# Patient Record
Sex: Female | Born: 1945 | Race: White | Hispanic: No | Marital: Married | Smoking: Former smoker
Health system: Southern US, Community
[De-identification: ages and names within clinical notes are randomized; demographics above are authoritative.]

## PROBLEM LIST (undated history)

## (undated) DIAGNOSIS — K219 Gastro-esophageal reflux disease without esophagitis: Secondary | ICD-10-CM

## (undated) DIAGNOSIS — R42 Dizziness and giddiness: Secondary | ICD-10-CM

## (undated) DIAGNOSIS — M779 Enthesopathy, unspecified: Secondary | ICD-10-CM

## (undated) DIAGNOSIS — K759 Inflammatory liver disease, unspecified: Secondary | ICD-10-CM

## (undated) DIAGNOSIS — E785 Hyperlipidemia, unspecified: Secondary | ICD-10-CM

## (undated) DIAGNOSIS — C801 Malignant (primary) neoplasm, unspecified: Secondary | ICD-10-CM

## (undated) DIAGNOSIS — M1990 Unspecified osteoarthritis, unspecified site: Secondary | ICD-10-CM

## (undated) DIAGNOSIS — I10 Essential (primary) hypertension: Secondary | ICD-10-CM

## (undated) HISTORY — PX: TONSILLECTOMY: SUR1361

## (undated) HISTORY — PX: ABDOMINAL HYSTERECTOMY: SHX81

## (undated) HISTORY — PX: CHOLECYSTECTOMY: SHX55

---

## 2001-05-22 HISTORY — PX: BREAST BIOPSY: SHX20

## 2001-05-22 HISTORY — PX: BREAST EXCISIONAL BIOPSY: SUR124

## 2004-06-21 ENCOUNTER — Ambulatory Visit: Payer: Self-pay | Admitting: Internal Medicine

## 2005-06-22 ENCOUNTER — Ambulatory Visit: Payer: Self-pay | Admitting: Internal Medicine

## 2005-09-16 ENCOUNTER — Emergency Department: Payer: Self-pay | Admitting: Internal Medicine

## 2005-09-24 ENCOUNTER — Emergency Department: Payer: Self-pay | Admitting: Emergency Medicine

## 2006-06-27 ENCOUNTER — Ambulatory Visit: Payer: Self-pay | Admitting: Otolaryngology

## 2006-07-12 ENCOUNTER — Ambulatory Visit: Payer: Self-pay | Admitting: Internal Medicine

## 2007-07-31 ENCOUNTER — Ambulatory Visit: Payer: Self-pay | Admitting: Internal Medicine

## 2007-10-10 ENCOUNTER — Ambulatory Visit: Payer: Self-pay | Admitting: Unknown Physician Specialty

## 2008-08-06 ENCOUNTER — Ambulatory Visit: Payer: Self-pay | Admitting: Internal Medicine

## 2008-11-13 ENCOUNTER — Ambulatory Visit: Payer: Self-pay

## 2009-03-22 ENCOUNTER — Ambulatory Visit: Payer: Self-pay | Admitting: Internal Medicine

## 2009-03-30 ENCOUNTER — Ambulatory Visit: Payer: Self-pay | Admitting: Internal Medicine

## 2009-04-15 ENCOUNTER — Inpatient Hospital Stay: Payer: Self-pay | Admitting: Internal Medicine

## 2009-08-10 ENCOUNTER — Ambulatory Visit: Payer: Self-pay | Admitting: Gastroenterology

## 2009-08-11 ENCOUNTER — Ambulatory Visit: Payer: Self-pay | Admitting: Internal Medicine

## 2010-09-05 ENCOUNTER — Ambulatory Visit: Payer: Self-pay | Admitting: Internal Medicine

## 2010-12-08 ENCOUNTER — Ambulatory Visit: Payer: Self-pay | Admitting: Gastroenterology

## 2010-12-15 ENCOUNTER — Ambulatory Visit: Payer: Self-pay | Admitting: Gastroenterology

## 2010-12-21 ENCOUNTER — Ambulatory Visit: Payer: Self-pay | Admitting: Obstetrics and Gynecology

## 2011-03-13 ENCOUNTER — Ambulatory Visit: Payer: Self-pay | Admitting: Gastroenterology

## 2011-03-20 ENCOUNTER — Ambulatory Visit: Payer: Self-pay | Admitting: Gastroenterology

## 2011-03-23 ENCOUNTER — Ambulatory Visit: Payer: Self-pay | Admitting: Gastroenterology

## 2011-03-29 ENCOUNTER — Ambulatory Visit: Payer: Self-pay | Admitting: Gastroenterology

## 2011-04-02 LAB — PATHOLOGY REPORT

## 2011-04-06 ENCOUNTER — Ambulatory Visit: Payer: Self-pay | Admitting: Anesthesiology

## 2011-04-10 ENCOUNTER — Ambulatory Visit: Payer: Self-pay | Admitting: Surgery

## 2011-04-20 ENCOUNTER — Ambulatory Visit: Payer: Self-pay | Admitting: Surgery

## 2011-06-30 ENCOUNTER — Ambulatory Visit: Payer: Self-pay | Admitting: Obstetrics and Gynecology

## 2011-07-26 DIAGNOSIS — R110 Nausea: Secondary | ICD-10-CM | POA: Insufficient documentation

## 2011-09-19 ENCOUNTER — Ambulatory Visit: Payer: Self-pay | Admitting: Internal Medicine

## 2011-10-07 ENCOUNTER — Ambulatory Visit: Payer: Self-pay | Admitting: Unknown Physician Specialty

## 2011-11-20 ENCOUNTER — Emergency Department: Payer: Self-pay | Admitting: Unknown Physician Specialty

## 2012-02-06 ENCOUNTER — Ambulatory Visit: Payer: Self-pay | Admitting: Internal Medicine

## 2012-09-19 ENCOUNTER — Ambulatory Visit: Payer: Self-pay | Admitting: Internal Medicine

## 2013-02-13 ENCOUNTER — Emergency Department: Payer: Self-pay | Admitting: Emergency Medicine

## 2013-03-05 ENCOUNTER — Ambulatory Visit: Payer: Self-pay | Admitting: Internal Medicine

## 2013-04-29 ENCOUNTER — Ambulatory Visit (INDEPENDENT_AMBULATORY_CARE_PROVIDER_SITE_OTHER): Payer: Medicare Other | Admitting: Podiatry

## 2013-04-29 ENCOUNTER — Encounter: Payer: Self-pay | Admitting: Podiatry

## 2013-04-29 ENCOUNTER — Ambulatory Visit (INDEPENDENT_AMBULATORY_CARE_PROVIDER_SITE_OTHER): Payer: Medicare Other

## 2013-04-29 VITALS — BP 120/70 | HR 69 | Resp 16 | Ht 65.0 in | Wt 180.0 lb

## 2013-04-29 DIAGNOSIS — M79609 Pain in unspecified limb: Secondary | ICD-10-CM

## 2013-04-29 DIAGNOSIS — M79671 Pain in right foot: Secondary | ICD-10-CM

## 2013-04-29 DIAGNOSIS — M7750 Other enthesopathy of unspecified foot: Secondary | ICD-10-CM

## 2013-04-29 MED ORDER — TRIAMCINOLONE ACETONIDE 10 MG/ML IJ SUSP
10.0000 mg | Freq: Once | INTRAMUSCULAR | Status: AC
  Administered 2013-04-29: 10 mg

## 2013-04-29 NOTE — Patient Instructions (Signed)
Plantar Fasciitis (Heel Spur Syndrome) with Rehab The plantar fascia is a fibrous, ligament-like, soft-tissue structure that spans the bottom of the foot. Plantar fasciitis is a condition that causes pain in the foot due to inflammation of the tissue. SYMPTOMS   Pain and tenderness on the underneath side of the foot.  Pain that worsens with standing or walking. CAUSES  Plantar fasciitis is caused by irritation and injury to the plantar fascia on the underneath side of the foot. Common mechanisms of injury include:  Direct trauma to bottom of the foot.  Damage to a small nerve that runs under the foot where the main fascia attaches to the heel bone.  Stress placed on the plantar fascia due to bone spurs. RISK INCREASES WITH:   Activities that place stress on the plantar fascia (running, jumping, pivoting, or cutting).  Poor strength and flexibility.  Improperly fitted shoes.  Tight calf muscles.  Flat feet.  Failure to warm-up properly before activity.  Obesity. PREVENTION  Warm up and stretch properly before activity.  Allow for adequate recovery between workouts.  Maintain physical fitness:  Strength, flexibility, and endurance.  Cardiovascular fitness.  Maintain a health body weight.  Avoid stress on the plantar fascia.  Wear properly fitted shoes, including arch supports for individuals who have flat feet.  PROGNOSIS  If treated properly, then the symptoms of plantar fasciitis usually resolve without surgery. However, occasionally surgery is necessary.  RELATED COMPLICATIONS   Recurrent symptoms that may result in a chronic condition.  Problems of the lower back that are caused by compensating for the injury, such as limping.  Pain or weakness of the foot during push-off following surgery.  Chronic inflammation, scarring, and partial or complete fascia tear, occurring more often from repeated injections.  TREATMENT  Treatment initially involves the  use of ice and medication to help reduce pain and inflammation. The use of strengthening and stretching exercises may help reduce pain with activity, especially stretches of the Achilles tendon. These exercises may be performed at home or with a therapist. Your caregiver may recommend that you use heel cups of arch supports to help reduce stress on the plantar fascia. Occasionally, corticosteroid injections are given to reduce inflammation. If symptoms persist for greater than 6 months despite non-surgical (conservative), then surgery may be recommended.   MEDICATION   If pain medication is necessary, then nonsteroidal anti-inflammatory medications, such as aspirin and ibuprofen, or other minor pain relievers, such as acetaminophen, are often recommended.  Do not take pain medication within 7 days before surgery.  Prescription pain relievers may be given if deemed necessary by your caregiver. Use only as directed and only as much as you need.  Corticosteroid injections may be given by your caregiver. These injections should be reserved for the most serious cases, because they may only be given a certain number of times.  HEAT AND COLD  Cold treatment (icing) relieves pain and reduces inflammation. Cold treatment should be applied for 10 to 15 minutes every 2 to 3 hours for inflammation and pain and immediately after any activity that aggravates your symptoms. Use ice packs or massage the area with a piece of ice (ice massage).  Heat treatment may be used prior to performing the stretching and strengthening activities prescribed by your caregiver, physical therapist, or athletic trainer. Use a heat pack or soak the injury in warm water.  SEEK IMMEDIATE MEDICAL CARE IF:  Treatment seems to offer no benefit, or the condition worsens.  Any medications  produce adverse side effects.  EXERCISES- RANGE OF MOTION (ROM) AND STRETCHING EXERCISES - Plantar Fasciitis (Heel Spur Syndrome) These exercises  may help you when beginning to rehabilitate your injury. Your symptoms may resolve with or without further involvement from your physician, physical therapist or athletic trainer. While completing these exercises, remember:   Restoring tissue flexibility helps normal motion to return to the joints. This allows healthier, less painful movement and activity.  An effective stretch should be held for at least 30 seconds.  A stretch should never be painful. You should only feel a gentle lengthening or release in the stretched tissue.  RANGE OF MOTION - Toe Extension, Flexion  Sit with your right / left leg crossed over your opposite knee.  Grasp your toes and gently pull them back toward the top of your foot. You should feel a stretch on the bottom of your toes and/or foot.  Hold this stretch for 10 seconds.  Now, gently pull your toes toward the bottom of your foot. You should feel a stretch on the top of your toes and or foot.  Hold this stretch for 10 seconds. Repeat  times. Complete this stretch 3 times per day.   RANGE OF MOTION - Ankle Dorsiflexion, Active Assisted  Remove shoes and sit on a chair that is preferably not on a carpeted surface.  Place right / left foot under knee. Extend your opposite leg for support.  Keeping your heel down, slide your right / left foot back toward the chair until you feel a stretch at your ankle or calf. If you do not feel a stretch, slide your bottom forward to the edge of the chair, while still keeping your heel down.  Hold this stretch for 10 seconds. Repeat 3 times. Complete this stretch 2 times per day.   STRETCH  Gastroc, Standing  Place hands on wall.  Extend right / left leg, keeping the front knee somewhat bent.  Slightly point your toes inward on your back foot.  Keeping your right / left heel on the floor and your knee straight, shift your weight toward the wall, not allowing your back to arch.  You should feel a gentle stretch  in the right / left calf. Hold this position for 10 seconds. Repeat 3 times. Complete this stretch 2 times per day.  STRETCH  Soleus, Standing  Place hands on wall.  Extend right / left leg, keeping the other knee somewhat bent.  Slightly point your toes inward on your back foot.  Keep your right / left heel on the floor, bend your back knee, and slightly shift your weight over the back leg so that you feel a gentle stretch deep in your back calf.  Hold this position for 10 seconds. Repeat 3 times. Complete this stretch 2 times per day.  STRETCH  Gastrocsoleus, Standing  Note: This exercise can place a lot of stress on your foot and ankle. Please complete this exercise only if specifically instructed by your caregiver.   Place the ball of your right / left foot on a step, keeping your other foot firmly on the same step.  Hold on to the wall or a rail for balance.  Slowly lift your other foot, allowing your body weight to press your heel down over the edge of the step.  You should feel a stretch in your right / left calf.  Hold this position for 10 seconds.  Repeat this exercise with a slight bend in your right /  left knee. Repeat 3 times. Complete this stretch 2 times per day.   STRENGTHENING EXERCISES - Plantar Fasciitis (Heel Spur Syndrome)  These exercises may help you when beginning to rehabilitate your injury. They may resolve your symptoms with or without further involvement from your physician, physical therapist or athletic trainer. While completing these exercises, remember:   Muscles can gain both the endurance and the strength needed for everyday activities through controlled exercises.  Complete these exercises as instructed by your physician, physical therapist or athletic trainer. Progress the resistance and repetitions only as guided.  STRENGTH - Towel Curls  Sit in a chair positioned on a non-carpeted surface.  Place your foot on a towel, keeping your heel  on the floor.  Pull the towel toward your heel by only curling your toes. Keep your heel on the floor. Repeat 3 times. Complete this exercise 2 times per day.  STRENGTH - Ankle Inversion  Secure one end of a rubber exercise band/tubing to a fixed object (table, pole). Loop the other end around your foot just before your toes.  Place your fists between your knees. This will focus your strengthening at your ankle.  Slowly, pull your big toe up and in, making sure the band/tubing is positioned to resist the entire motion.  Hold this position for 10 seconds.  Have your muscles resist the band/tubing as it slowly pulls your foot back to the starting position. Repeat 3 times. Complete this exercises 2 times per day.  Document Released: 05/08/2005 Document Revised: 07/31/2011 Document Reviewed: 08/20/2008 [facility] Patient Information 2014 [place], [place].Plantar Fasciitis Plantar fasciitis is a common condition that causes foot pain. It is soreness (inflammation) of the band of tough fibrous tissue on the bottom of the foot that runs from the heel bone (calcaneus) to the ball of the foot. The cause of this soreness may be from excessive standing, poor fitting shoes, running on hard surfaces, being overweight, having an abnormal walk, or overuse (this is common in runners) of the painful foot or feet. It is also common in aerobic exercise dancers and ballet dancers. SYMPTOMS  Most people with plantar fasciitis complain of: Severe pain in the morning on the bottom of their foot especially when taking the first steps out of bed. This pain recedes after a few minutes of walking. Severe pain is experienced also during walking following a long period of inactivity. Pain is worse when walking barefoot or up stairs DIAGNOSIS  Your caregiver will diagnose this condition by examining and feeling your foot. Special tests such as X-rays of your foot, are usually not needed. PREVENTION  Consult a sports  medicine professional before beginning a new exercise program. Walking programs offer a good workout. With walking there is a lower chance of overuse injuries common to runners. There is less impact and less jarring of the joints. Begin all new exercise programs slowly. If problems or pain develop, decrease the amount of time or distance until you are at a comfortable level. Wear good shoes and replace them regularly. Stretch your foot and the heel cords at the back of the ankle (Achilles tendon) both before and after exercise. Run or exercise on even surfaces that are not hard. For example, asphalt is better than pavement. Do not run barefoot on hard surfaces. If using a treadmill, vary the incline. Do not continue to workout if you have foot or joint problems. Seek professional help if they do not improve. HOME CARE INSTRUCTIONS  Avoid activities that cause you  pain until you recover. Use ice or cold packs on the problem or painful areas after working out. Only take over-the-counter or prescription medicines for pain, discomfort, or fever as directed by your caregiver. Soft shoe inserts or athletic shoes with air or gel sole cushions may be helpful. If problems continue or become more severe, consult a sports medicine caregiver or your own health care provider. Cortisone is a potent anti-inflammatory medication that may be injected into the painful area. You can discuss this treatment with your caregiver. MAKE SURE YOU:  Understand these instructions. Will watch your condition. Will get help right away if you are not doing well or get worse. Document Released: 01/31/2001 Document Revised: 07/31/2011 Document Reviewed: 04/01/2008 [facility] Patient Information 2014 [place], [place].

## 2013-04-29 NOTE — Progress Notes (Signed)
   Subjective:    Patient ID: Kerry Jefferson, female    DOB: [birth year], 67 y.o.   MRN: [id number]  HPI Comments: i think i have plantar fasciitis on my right foot    N very sore  L right arch  D 2 months  O all of a sudden  C it comes and goes A worse when walking on it  T ice       Review of Systems  All other systems reviewed and are negative.       Objective:   Physical Exam        Assessment & Plan:

## 2013-04-30 NOTE — Progress Notes (Signed)
Subjective:     Patient ID: Kerry Jefferson, female   DOB: 10-Jul-1945, 67 y.o.   MRN: [id number]  Foot Pain   patient presents stating I have pain on the inside of my right foot which has been getting worse over the last 2 months. Patient states that she has been painting and on a ladder quite a bit around the time that the pain started   Review of Systems  All other systems reviewed and are negative.       Objective:   Physical Exam  Vitals reviewed. Constitutional: She is oriented to person, place, and time.  Cardiovascular: Intact distal pulses.   Musculoskeletal: Normal range of motion.  Neurological: She is oriented to person, place, and time.  Skin: Skin is warm.   neurovascular status intact with normal muscle strength noted. Tenderness around the insertion of the posterior tibial tendon into the navicular right with no indications of tendon rupture or tear. Minimal discomfort plantar heel noted     Assessment:     Posterior tibial tendinitis secondary to excessive activity and chronic structural foot condition with patient having old orthotics that she has not been wearing    Plan:     H&P and x-rays reviewed. Condition and explained and today careful injection administered at the insertion 3 mg Kenalog 5 mg Xylocaine Marcaine mixture with advice on reduced activity. Dispensed ankle brace in order to support the plantar arch and prevent stress on posterior tibial tendon and reappoint in 2 weeks

## 2013-05-13 ENCOUNTER — Ambulatory Visit: Payer: Medicare Other | Admitting: Podiatry

## 2013-11-10 DIAGNOSIS — R51 Headache: Secondary | ICD-10-CM

## 2013-11-10 DIAGNOSIS — I10 Essential (primary) hypertension: Secondary | ICD-10-CM | POA: Insufficient documentation

## 2013-11-10 DIAGNOSIS — E7800 Pure hypercholesterolemia, unspecified: Secondary | ICD-10-CM | POA: Insufficient documentation

## 2013-11-10 DIAGNOSIS — E782 Mixed hyperlipidemia: Secondary | ICD-10-CM | POA: Insufficient documentation

## 2013-11-10 DIAGNOSIS — R519 Headache, unspecified: Secondary | ICD-10-CM | POA: Insufficient documentation

## 2013-11-11 ENCOUNTER — Ambulatory Visit: Payer: Self-pay | Admitting: Internal Medicine

## 2013-11-17 DIAGNOSIS — M1990 Unspecified osteoarthritis, unspecified site: Secondary | ICD-10-CM | POA: Insufficient documentation

## 2013-12-30 DIAGNOSIS — R200 Anesthesia of skin: Secondary | ICD-10-CM | POA: Insufficient documentation

## 2013-12-30 DIAGNOSIS — R253 Fasciculation: Secondary | ICD-10-CM | POA: Insufficient documentation

## 2014-02-10 DIAGNOSIS — I10 Essential (primary) hypertension: Secondary | ICD-10-CM | POA: Insufficient documentation

## 2014-07-21 DIAGNOSIS — I491 Atrial premature depolarization: Secondary | ICD-10-CM | POA: Insufficient documentation

## 2014-09-18 ENCOUNTER — Other Ambulatory Visit: Payer: Self-pay

## 2014-09-18 ENCOUNTER — Other Ambulatory Visit: Payer: Self-pay | Admitting: Internal Medicine

## 2014-09-18 DIAGNOSIS — Z87891 Personal history of nicotine dependence: Secondary | ICD-10-CM

## 2014-09-18 DIAGNOSIS — R0789 Other chest pain: Secondary | ICD-10-CM

## 2014-09-18 DIAGNOSIS — Z1231 Encounter for screening mammogram for malignant neoplasm of breast: Secondary | ICD-10-CM

## 2014-09-24 ENCOUNTER — Ambulatory Visit
Admission: RE | Admit: 2014-09-24 | Discharge: 2014-09-24 | Disposition: A | Discharge status: Home / Self Care | Payer: Medicare Other | Source: Ambulatory Visit | Attending: Internal Medicine | Admitting: Internal Medicine

## 2014-09-24 DIAGNOSIS — R918 Other nonspecific abnormal finding of lung field: Secondary | ICD-10-CM | POA: Diagnosis not present

## 2014-09-24 DIAGNOSIS — Z87891 Personal history of nicotine dependence: Secondary | ICD-10-CM | POA: Insufficient documentation

## 2014-09-24 DIAGNOSIS — R079 Chest pain, unspecified: Secondary | ICD-10-CM | POA: Diagnosis present

## 2014-09-24 DIAGNOSIS — R0789 Other chest pain: Secondary | ICD-10-CM

## 2014-11-17 ENCOUNTER — Ambulatory Visit
Admission: RE | Admit: 2014-11-17 | Discharge: 2014-11-17 | Disposition: A | Discharge status: Home / Self Care | Payer: Medicare Other | Source: Ambulatory Visit | Attending: Internal Medicine | Admitting: Internal Medicine

## 2014-11-17 ENCOUNTER — Other Ambulatory Visit: Payer: Self-pay

## 2014-11-17 DIAGNOSIS — Z1231 Encounter for screening mammogram for malignant neoplasm of breast: Secondary | ICD-10-CM

## 2015-04-05 ENCOUNTER — Emergency Department
Admission: EM | Admit: 2015-04-05 | Discharge: 2015-04-06 | Disposition: A | Discharge status: Home / Self Care | Payer: Medicare Other | Attending: Emergency Medicine | Admitting: Emergency Medicine

## 2015-04-05 DIAGNOSIS — I160 Hypertensive urgency: Secondary | ICD-10-CM | POA: Diagnosis not present

## 2015-04-05 DIAGNOSIS — Z87891 Personal history of nicotine dependence: Secondary | ICD-10-CM | POA: Insufficient documentation

## 2015-04-05 DIAGNOSIS — I10 Essential (primary) hypertension: Secondary | ICD-10-CM | POA: Diagnosis present

## 2015-04-05 DIAGNOSIS — Z880 Allergy status to penicillin: Secondary | ICD-10-CM | POA: Diagnosis not present

## 2015-04-05 DIAGNOSIS — Z79899 Other long term (current) drug therapy: Secondary | ICD-10-CM | POA: Insufficient documentation

## 2015-04-05 DIAGNOSIS — Z7982 Long term (current) use of aspirin: Secondary | ICD-10-CM | POA: Insufficient documentation

## 2015-04-05 LAB — BASIC METABOLIC PANEL
Anion gap: 8 (ref 5–15)
BUN: 15 mg/dL (ref 6–20)
CALCIUM: 8.8 mg/dL — AB (ref 8.9–10.3)
CO2: 29 mmol/L (ref 22–32)
Chloride: 103 mmol/L (ref 101–111)
Creatinine, Ser: 0.68 mg/dL (ref 0.44–1.00)
GFR calc Af Amer: >60 mL/min (ref >60)
Glucose, Bld: 116 mg/dL — ABNORMAL HIGH (ref 65–99)
Potassium: 3.9 mmol/L (ref 3.5–5.1)
Sodium: 140 mmol/L (ref 135–145)

## 2015-04-05 LAB — TROPONIN I: Troponin I: <0.03 ng/mL (ref <0.031)

## 2015-04-05 MED ORDER — CLONIDINE HCL 0.1 MG PO TABS
0.2000 mg | ORAL_TABLET | Freq: Once | ORAL | Status: AC
  Administered 2015-04-05: 0.2 mg via ORAL
  Filled 2015-04-05: qty 2

## 2015-04-05 MED ORDER — LORAZEPAM 1 MG PO TABS
1.0000 mg | ORAL_TABLET | Freq: Once | ORAL | Status: AC
  Administered 2015-04-05: 1 mg via ORAL
  Filled 2015-04-05: qty 1

## 2015-04-05 NOTE — ED Provider Notes (Signed)
Time Seen: Approximately 2215  I have reviewed the triage notes  Chief Complaint: Hypertension   History of Present Illness: Kerry Jefferson is a 69 y.o. female who is been on ongoing treatment for hypertension. Patient's currently on Hyzaar along with Toprol-XL. The patient's had recent increase of her Toprol from the first 50 mg to 75 mg and then received another 25 mg prior to arrival after conversation with her primary physician. He said some mild feelings of lightheadedness but otherwise no significant chest pain shortness of breath, headaches, swelling in the extremities or any other concerns. Patient had similar numbers that she had here on arrival of systolic pressures over 263 and diastolic pressures approximating 90 to 100 at home. Patient was referred here by her primary physician for evaluation of her hypertension.   No past medical history on file.  There are no active problems to display for this patient.   Past Surgical History  Procedure Laterality Date  . Breast biopsy Left 2003    negative    Past Surgical History  Procedure Laterality Date  . Breast biopsy Left 2003    negative    Current Outpatient Rx  Name  Route  Sig  Dispense  Refill  . aspirin EC 81 MG tablet   Oral   Take 1 tablet by mouth daily.         . Cholecalciferol (VITAMIN D-1000 MAX ST) 1000 UNITS tablet   Oral   Take 1 tablet by mouth daily.         Marland Kitchen losartan-hydrochlorothiazide (HYZAAR) 100-12.5 MG per tablet   Oral   Take 1 tablet by mouth daily.          . metaxalone (SKELAXIN) 800 MG tablet   Oral   Take 1 tablet by mouth 3 (three) times daily as needed. Pt has not started this medication yet.         . Omega-3 1000 MG CAPS   Oral   Take 1 capsule by mouth daily.         . simvastatin (ZOCOR) 80 MG tablet   Oral   Take 80 mg by mouth daily at 6 PM.          . TOPROL XL 100 MG 24 hr tablet   Oral   Take 100 mg by mouth daily.            Allergies:   Ciprofloxacin; Penicillins; and Sulfa antibiotics  Family History: Family History  Problem Relation Age of Onset  . Breast cancer Maternal Grandmother 80    Social History: Social History  Substance Use Topics  . Smoking status: Former Research scientist (life sciences)  . Smokeless tobacco: Never Used     Comment: quit 10 years ago  . Alcohol Use: No     Review of Systems:   10 point review of systems was performed and was otherwise negative:  Constitutional: No fever Eyes: No visual disturbances ENT: No sore throat, ear pain Cardiac: No chest pain Respiratory: No shortness of breath, wheezing, or stridor Abdomen: No abdominal pain, no vomiting, No diarrhea Endocrine: No weight loss, No night sweats Extremities: No peripheral edema, cyanosis Skin: No rashes, easy bruising Neurologic: No focal weakness, trouble with speech or swollowing Urologic: No dysuria, Hematuria, or urinary frequency   Physical Exam:  ED Triage Vitals  Enc Vitals Group     BP 04/05/15 2036 200/90 mmHg     Pulse Rate 04/05/15 2036 68     Resp --  Temp 04/05/15 2036 98.3 F (36.8 C)     Temp Source 04/05/15 2036 Oral     SpO2 04/05/15 2036 98 %     Weight 04/05/15 2036 190 lb (86.183 kg)     Height 04/05/15 2036 5' 5"  (1.651 m)     Head Cir --      Peak Flow --      Pain Score --      Pain Loc --      Pain Edu? --      Excl. in [place]? --     General: Awake , Alert , and Oriented times 3; GCS 15 Head: Normal cephalic , atraumatic Eyes: Pupils equal , round, reactive to light Nose/Throat: No nasal drainage, patent upper airway without erythema or exudate.  Neck: Supple, Full range of motion, No anterior adenopathy or palpable thyroid masses Lungs: Clear to ascultation without wheezes , rhonchi, or rales Heart: Regular rate, regular rhythm without murmurs , gallops , or rubs Abdomen: Soft, non tender without rebound, guarding , or rigidity; bowel sounds positive and symmetric in all 4 quadrants. No organomegaly  .        Extremities: 2 plus symmetric pulses. No edema, clubbing or cyanosis Neurologic: normal ambulation, Motor symmetric without deficits, sensory intact Skin: warm, dry, no rashes   Labs:   All laboratory work was reviewed including any pertinent negatives or positives listed below:  Labs Reviewed  [place]  CBC WITH DIFFERENTIAL/PLATELET  TROPONIN I   Laboratory work was reviewed and showed no significant abnormalities EKG: * ED ECG REPORT I, Daymon Larsen, the attending physician, personally viewed and interpreted this ECG.  Date: 04/05/2015 EKG Time: 2042 Rate: 64 Rhythm: normal sinus rhythm QRS Axis: normal Intervals: normal ST/T Wave abnormalities: Nonspecific ST-T wave abnormality Conduction Disutrbances: none Narrative Interpretation: unremarkable       ED Course: Patient's stay here was uneventful and she was given clonidine 0.2 mg by mouth along with Ativan 1 mg by mouth for symptomatic improvement. Serial blood pressures were obtained and the patient dropped down into the low 825K systolic. Patient feels symptomatically improved and felt could be treated on an outpatient basis. She was prescribed clonidine 0.1 mg as needed at home for systolic pressures in the 170 range. Then advised to follow up with her primary physician for further fine tuning of her medication to see if it is any other adjustments that would like to be made.    Assessment:  Hypertensive urgency     Plan:  Outpatient management Patient was advised to return immediately if condition worsens. Patient was advised to follow up with her primary care physician or other specialized physicians involved and in their current assessment.             Daymon Larsen, MD 04/06/15 [phone]

## 2015-04-05 NOTE — ED Notes (Signed)
Pt to triage via w/c with no distress noted; pt reports BP elevated at home this evening accomp by dizziness; recently toprol increased by BP has been increasing as well

## 2015-04-06 LAB — CBC WITH DIFFERENTIAL/PLATELET
BAND NEUTROPHILS: 0 %
BASOS ABS: 0.1 10*3/uL (ref 0–0.1)
BLASTS: 0 %
Basophils Relative: 1 %
EOS ABS: 0.1 10*3/uL (ref 0–0.7)
Eosinophils Relative: 1 %
HCT: 39.9 % (ref 35.0–47.0)
HEMOGLOBIN: 13.1 g/dL (ref 12.0–16.0)
Lymphocytes Relative: 18 %
Lymphs Abs: 1.9 10*3/uL (ref 1.0–3.6)
MCH: 28.2 pg (ref 26.0–34.0)
MCHC: 32.9 g/dL (ref 32.0–36.0)
MCV: 85.6 fL (ref 80.0–100.0)
MONO ABS: 0.6 10*3/uL (ref 0.2–0.9)
MYELOCYTES: 0 %
Metamyelocytes Relative: 0 %
Monocytes Relative: 6 %
Neutro Abs: 7.7 10*3/uL — ABNORMAL HIGH (ref 1.4–6.5)
Neutrophils Relative %: 74 %
OTHER: 0 %
Platelets: 159 10*3/uL (ref 150–440)
Promyelocytes Absolute: 0 %
RBC: 4.66 MIL/uL (ref 3.80–5.20)
RDW: 13.8 % (ref 11.5–14.5)
WBC: 10.4 10*3/uL (ref 3.6–11.0)
nRBC: 0 /100 WBC

## 2015-04-06 MED ORDER — CLONIDINE HCL 0.1 MG PO TABS: 0.1000 mg | ORAL_TABLET | Freq: Two times a day (BID) | ORAL | Status: DC | PRN

## 2015-06-11 ENCOUNTER — Other Ambulatory Visit: Payer: Self-pay | Admitting: Internal Medicine

## 2015-06-11 DIAGNOSIS — M542 Cervicalgia: Secondary | ICD-10-CM

## 2015-06-15 ENCOUNTER — Ambulatory Visit (INDEPENDENT_AMBULATORY_CARE_PROVIDER_SITE_OTHER): Payer: Medicare Other | Admitting: Sports Medicine

## 2015-06-15 ENCOUNTER — Encounter: Payer: Self-pay | Admitting: Sports Medicine

## 2015-06-15 ENCOUNTER — Ambulatory Visit (INDEPENDENT_AMBULATORY_CARE_PROVIDER_SITE_OTHER): Payer: Medicare Other

## 2015-06-15 DIAGNOSIS — M79671 Pain in right foot: Secondary | ICD-10-CM

## 2015-06-15 DIAGNOSIS — M7661 Achilles tendinitis, right leg: Secondary | ICD-10-CM | POA: Diagnosis not present

## 2015-06-15 NOTE — Progress Notes (Signed)
Patient ID: Kerry Jefferson, female   DOB: 1945/10/10, 70 y.o.   MRN: [id number] Subjective: Kerry Jefferson is a 70 y.o. female patient who presents to office for evaluation of Right posterior heel pain. Patient complains of progressive pain at the back of her right heel that started 1 month after walking on threadmill. Patient has tried heat with no relief in symptoms. Patient denies any other pedal complaints.   There are no active problems to display for this patient.  Current Outpatient Prescriptions on File Prior to Visit  Medication Sig Dispense Refill  . aspirin EC 81 MG tablet Take 1 tablet by mouth daily.    . Cholecalciferol (VITAMIN D-1000 MAX ST) 1000 UNITS tablet Take 1 tablet by mouth daily.    . cloNIDine (CATAPRES) 0.1 MG tablet Take 1 tablet (0.1 mg total) by mouth 2 (two) times daily as needed. 20 tablet 0  . losartan-hydrochlorothiazide (HYZAAR) 100-12.5 MG per tablet Take 1 tablet by mouth daily.     . metaxalone (SKELAXIN) 800 MG tablet Take 1 tablet by mouth 3 (three) times daily as needed. Pt has not started this medication yet.    . Omega-3 1000 MG CAPS Take 1 capsule by mouth daily.    . simvastatin (ZOCOR) 80 MG tablet Take 80 mg by mouth daily at 6 PM.     . TOPROL XL 100 MG 24 hr tablet Take 100 mg by mouth daily.      No current facility-administered medications on file prior to visit.   Allergies  Allergen Reactions  . Atenolol Other (See Comments)    fatigue  . Ciprofloxacin Other (See Comments)    Caused drug induced hepatitis   . Ciprofloxacin Hcl Other (See Comments)    Hepatitis  . Lisinopril Other (See Comments)    Fatigue  . Moxifloxacin Other (See Comments)    Hepatitis  . Sertraline Other (See Comments)  . Sulfa Antibiotics Other (See Comments)    Hallucinations   . Penicillins Itching and Other (See Comments)    pruritus   Objective:  General: Alert and oriented x3 in no acute distress  Dermatology: No open lesions bilateral lower  extremities, no webspace macerations, no ecchymosis bilateral, all nails x 10 are well manicured.  Vascular: Dorsalis Pedis and Posterior Tibial pedal pulses 2/4, Capillary Fill Time 3 seconds, + pedal hair growth bilateral, Temperature gradient within normal limits.  Neurology: Gross sensation intact via light touch bilateral. - Tinels sign.   Musculoskeletal: Mild tenderness with palpation at insertion of the Achilles on Right, there is no calcaneal exostosis but mild soft tissue swelling at lateral portion on the insertion present and mild decreased ankle rom with knee extending  vs flexed resembling gastroc equnius bilateral, The achilles tendon feels intact with no nodularity or palpable dell, Thompson sign negative, Subtalar and midtarsal joint range of motion is within normal limits, there is no 1st ray hypermobility or bunion, hammertoe, midfoot exostosis deformity noted bilateral.   Xrays  Right foot    Impression: Mild decreased in osseous mineralization. Joint spaces narrowing at 1st MTPJ, dorsal midfoot with spurs, bunion and hammertoe deformity, calcaneal spur posterior and inferior. No fracture/dislocation/boney destruction. Kager's triangle intact with no obliteration. No soft tissue abnormalities or radiopaque foreign bodies.   Assessment and Plan: Problem List Items Addressed This Visit    None    Visit Diagnoses    Right foot pain    -  Primary    Relevant Orders  DG Foot 2 Views Right    Tendonitis, Achilles, right          -Complete examination performed -Xrays reviewed -Discussed treatement options -Patient declined oral anti-inflammatories due to hx of LFT rise from Avalox  -Dispensed night splint and ice pack and instructed on daily use -Advised patient to refrain from use of heat -Advised patient to refrain from walking/exercise that involves inclines, hills, steps to prevent exacerbation of symptoms  -No improvement will consider MRI/PT/EPAT -Patient to  return to office in 2 weeks or sooner if condition worsens.  Landis Martins, DPM

## 2015-06-29 ENCOUNTER — Encounter: Payer: Self-pay | Admitting: Sports Medicine

## 2015-06-29 ENCOUNTER — Ambulatory Visit (INDEPENDENT_AMBULATORY_CARE_PROVIDER_SITE_OTHER): Payer: Medicare Other | Admitting: Sports Medicine

## 2015-06-29 DIAGNOSIS — M7661 Achilles tendinitis, right leg: Secondary | ICD-10-CM | POA: Diagnosis not present

## 2015-06-29 DIAGNOSIS — M79671 Pain in right foot: Secondary | ICD-10-CM | POA: Diagnosis not present

## 2015-06-29 NOTE — Progress Notes (Addendum)
Patient ID: WAFAA DEEMER, female   DOB: 02-05-1946, 70 y.o.   MRN: [id number]  Subjective: LAKEISA HENINGER is a 70 y.o. female patient who returnss to office for evaluation of Right posterior heel pain. Patient states that her heel is starting to feel better after icing and night splint especially over the last few days. Patient denies any other pedal complaints.   There are no active problems to display for this patient.  Current Outpatient Prescriptions on File Prior to Visit  Medication Sig Dispense Refill  . aspirin EC 81 MG tablet Take 1 tablet by mouth daily.    . Cholecalciferol (VITAMIN D-1000 MAX ST) 1000 UNITS tablet Take 1 tablet by mouth daily.    . cloNIDine (CATAPRES) 0.1 MG tablet Take 1 tablet (0.1 mg total) by mouth 2 (two) times daily as needed. 20 tablet 0  . losartan-hydrochlorothiazide (HYZAAR) 100-12.5 MG per tablet Take 1 tablet by mouth daily.     . metaxalone (SKELAXIN) 800 MG tablet Take 1 tablet by mouth 3 (three) times daily as needed. Pt has not started this medication yet.    . Omega-3 1000 MG CAPS Take 1 capsule by mouth daily.    . simvastatin (ZOCOR) 80 MG tablet Take 80 mg by mouth daily at 6 PM.     . TOPROL XL 100 MG 24 hr tablet Take 100 mg by mouth daily.      No current facility-administered medications on file prior to visit.   Allergies  Allergen Reactions  . Atenolol Other (See Comments)    fatigue  . Ciprofloxacin Other (See Comments)    Caused drug induced hepatitis   . Ciprofloxacin Hcl Other (See Comments)    Hepatitis  . Lisinopril Other (See Comments)    Fatigue  . Moxifloxacin Other (See Comments)    Hepatitis  . Sertraline Other (See Comments)  . Sulfa Antibiotics Other (See Comments)    Hallucinations   . Penicillins Itching and Other (See Comments)    pruritus   Objective:  General: Alert and oriented x3 in no acute distress  Dermatology: No open lesions bilateral lower extremities, no webspace macerations, no ecchymosis  bilateral, all nails x 10 are well manicured.  Vascular: Dorsalis Pedis and Posterior Tibial pedal pulses 2/4, Capillary Fill Time 3 seconds, + pedal hair growth bilateral, Temperature gradient within normal limits.  Neurology: Gross sensation intact via light touch bilateral. - Tinels sign.   Musculoskeletal: Decreased tenderness with palpation at insertion of the Achilles on Right, there is no calcaneal exostosis but mild soft tissue swelling at lateral portion on the insertion present and mild decreased ankle rom with knee extending  vs flexed resembling gastroc equnius bilateral, The achilles tendon feels intact with no nodularity or palpable dell, Thompson sign negative, Subtalar and midtarsal joint range of motion is within normal limits, there is no 1st ray hypermobility or bunion, hammertoe, midfoot exostosis deformity noted bilateral.    Assessment and Plan: Problem List Items Addressed This Visit    None    Visit Diagnoses    Right foot pain    -  Primary    Tendonitis, Achilles, right          -Complete examination performed -Discussed treatement options -Patient declined oral anti-inflammatories due to hx of LFT rise from Avalox  -Cont with night splint and ice pack daily until symptoms are completely resolved -Advised patient to refrain from walking/exercise that involves inclines, hills, steps to prevent exacerbation of symptoms; May attempt  to walk on flat surfaces only in moderation -Recommend good supportive shoes daily -Rx given for PT at [facility]; Patient wants to check with her doctor about her neck because she may have PT for that too and wants to get it done at the same place -Patient to return to office in 4 weeks or sooner if condition worsens.  07-29-15 ADDENDUM: PATIENT CALLED OFFICE TODAY STATING THAT SHE IS HAVING MORE PAIN WITH PT; ADVISED TO DISCONTINUE PT FOR NOW AND PATIENT WAS ADVISED TO STOP BY OFFICE TO PICK UP CAM BOOT OF WHICH SHE DID TODAY AROUND 2PM;  PATIENT WAS EDUCATED ON HOW TO USE THE BOOT; PATIENT TO FOLLOW UP WITH ME ON 3-31 AND TO WEAR THE BOOT UNTIL THEN; PATIENT TO CONTINUE WITH ICE AND NIGHT SPLINT IN THE MEANTIME AND ADVISED TO REFRAIN FROM EXTENSIVE ACTIVITY THAT MAY CAUSE EXACERBATION OF SYMPTOMS  Landis Martins, DPM

## 2015-07-01 ENCOUNTER — Ambulatory Visit
Admission: RE | Admit: 2015-07-01 | Discharge: 2015-07-01 | Disposition: A | Discharge status: Home / Self Care | Payer: Medicare Other | Source: Ambulatory Visit | Attending: Internal Medicine | Admitting: Internal Medicine

## 2015-07-01 DIAGNOSIS — M2578 Osteophyte, vertebrae: Secondary | ICD-10-CM | POA: Insufficient documentation

## 2015-07-01 DIAGNOSIS — M542 Cervicalgia: Secondary | ICD-10-CM | POA: Insufficient documentation

## 2015-07-01 DIAGNOSIS — M50323 Other cervical disc degeneration at C6-C7 level: Secondary | ICD-10-CM | POA: Diagnosis not present

## 2015-07-01 DIAGNOSIS — M4312 Spondylolisthesis, cervical region: Secondary | ICD-10-CM | POA: Diagnosis not present

## 2015-07-21 DIAGNOSIS — E785 Hyperlipidemia, unspecified: Secondary | ICD-10-CM | POA: Insufficient documentation

## 2015-07-29 ENCOUNTER — Telehealth: Payer: Self-pay | Admitting: Sports Medicine

## 2015-07-29 ENCOUNTER — Other Ambulatory Visit: Payer: Self-pay | Admitting: Sports Medicine

## 2015-07-29 NOTE — Telephone Encounter (Signed)
I spoke with the patient. She is going to come by the office around 2 PM today to pick up a CAM boot. Thanks Dr. Cannon Kettle

## 2015-07-29 NOTE — Telephone Encounter (Signed)
Patient called said she has 2 more visits next week of physical therapy at [facility] and she said that her Achilles Tendonitis is no better, it is actually getting worse with this therapy. She is scheduled to come back here and see Dr. Cannon Kettle on 08/20/15 but she said she thinks Dr. Cannon Kettle needs to put her in a boot or something else before that. Please call patient at home at [phone] to discuss her plan of treatment going forward and if she needs to keep those last two appointments next week at [facility].  Thank you !

## 2015-07-29 NOTE — Telephone Encounter (Signed)
Returned patient called. Reports that she is getting more pain after PT. Recommend to hold off on continuing physical therapy for now. Instructed patient to go by office to pick up Cam Walker to use daily for ambulation to continue with icing and elevation. No extensive activity or stretching, continue with night splint only while in bed for 1 hour. Patient expressed understanding. We'll follow up as scheduled on 3-31 or sooner if problems or issues arise. -Dr. Cannon Kettle

## 2015-08-20 ENCOUNTER — Ambulatory Visit (INDEPENDENT_AMBULATORY_CARE_PROVIDER_SITE_OTHER): Payer: Medicare Other | Admitting: Sports Medicine

## 2015-08-20 ENCOUNTER — Encounter: Payer: Self-pay | Admitting: Sports Medicine

## 2015-08-20 ENCOUNTER — Telehealth: Payer: Self-pay | Admitting: *Deleted

## 2015-08-20 DIAGNOSIS — Z8719 Personal history of other diseases of the digestive system: Secondary | ICD-10-CM | POA: Insufficient documentation

## 2015-08-20 DIAGNOSIS — Z8619 Personal history of other infectious and parasitic diseases: Secondary | ICD-10-CM | POA: Insufficient documentation

## 2015-08-20 DIAGNOSIS — M7661 Achilles tendinitis, right leg: Secondary | ICD-10-CM | POA: Diagnosis not present

## 2015-08-20 DIAGNOSIS — M810 Age-related osteoporosis without current pathological fracture: Secondary | ICD-10-CM | POA: Insufficient documentation

## 2015-08-20 DIAGNOSIS — K635 Polyp of colon: Secondary | ICD-10-CM | POA: Insufficient documentation

## 2015-08-20 DIAGNOSIS — N83201 Unspecified ovarian cyst, right side: Secondary | ICD-10-CM | POA: Insufficient documentation

## 2015-08-20 DIAGNOSIS — N809 Endometriosis, unspecified: Secondary | ICD-10-CM | POA: Insufficient documentation

## 2015-08-20 DIAGNOSIS — K219 Gastro-esophageal reflux disease without esophagitis: Secondary | ICD-10-CM | POA: Insufficient documentation

## 2015-08-20 DIAGNOSIS — M79671 Pain in right foot: Secondary | ICD-10-CM | POA: Diagnosis not present

## 2015-08-20 DIAGNOSIS — J309 Allergic rhinitis, unspecified: Secondary | ICD-10-CM | POA: Insufficient documentation

## 2015-08-20 DIAGNOSIS — T148XXA Other injury of unspecified body region, initial encounter: Secondary | ICD-10-CM

## 2015-08-20 DIAGNOSIS — M8580 Other specified disorders of bone density and structure, unspecified site: Secondary | ICD-10-CM | POA: Insufficient documentation

## 2015-08-20 NOTE — Telephone Encounter (Addendum)
-----   Message from Landis Martins, [place] sent at 08/20/2015 10:16 AM EDT ----- Regarding: MRI Right Please order and arrange MRI w/o contrast right ankle r/o partial tear achilles vs acute on chronic tendonitis  Thanks Dr. Cannon Kettle 08/23/2015-[facility] [place].  FAXED TO [place].

## 2015-08-20 NOTE — Progress Notes (Signed)
Patient ID: ARLYN BUMPUS, female   DOB: 07/13/1945, 70 y.o.   MRN: [id number]   Subjective: Kerry Jefferson is a 70 y.o. female patient who returns to office for evaluation of Right posterior heel pain. Patient has been in cam boot since she had a flareup with physical therapy, of which we discontinued at this time, patient has been using CAM boot for the last 2-1/2 weeks with improvement in symptoms. However states that when she did get up without the boot on she noticed that she had pain in toe off or when the area was directly pressed.  Patient denies any other pedal complaints.   Patient Active Problem List   Diagnosis Date Noted  . Allergic rhinitis 08/20/2015  . Colon polyp 08/20/2015  . Cyst of right ovary 08/20/2015  . Endometriosis 08/20/2015  . Acid reflux 08/20/2015  . History of colitis 08/20/2015  . H/O viral illness 08/20/2015  . Osteopenia 08/20/2015  . 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 HLD (hyperlipidemia) 07/21/2015  . APC (atrial premature contractions) 07/21/2014  . Essential (primary) hypertension 02/10/2014  . Facial numbness 12/30/2013  . Jerking 12/30/2013  . Arthritis, degenerative 11/17/2013  . Benign essential HTN 11/10/2013  . Cephalalgia 11/10/2013  . Pure hypercholesterolemia 11/10/2013  . Feeling bilious 07/26/2011   Current Outpatient Prescriptions on File Prior to Visit  Medication Sig Dispense Refill  . aspirin EC 81 MG tablet Take 1 tablet by mouth daily.    . Cholecalciferol (VITAMIN D-1000 MAX ST) 1000 UNITS tablet Take 1 tablet by mouth daily.    . cloNIDine (CATAPRES) 0.1 MG tablet Take 1 tablet (0.1 mg total) by mouth 2 (two) times daily as needed. 20 tablet 0  . losartan-hydrochlorothiazide (HYZAAR) 100-12.5 MG per tablet Take 1 tablet by mouth daily.     . metaxalone (SKELAXIN) 800 MG tablet Take 1 tablet by mouth 3 (three) times daily as needed. Pt has not started this medication yet.    . Omega-3 1000 MG CAPS Take 1 capsule by  mouth daily.    . simvastatin (ZOCOR) 80 MG tablet Take 80 mg by mouth daily at 6 PM.     . TOPROL XL 100 MG 24 hr tablet Take 100 mg by mouth daily.      No current facility-administered medications on file prior to visit.   Allergies  Allergen Reactions  . Atenolol Other (See Comments)    fatigue  . Ciprofloxacin Other (See Comments)    Caused drug induced hepatitis   . Ciprofloxacin Hcl Other (See Comments)    Hepatitis  . Lisinopril Other (See Comments)    Fatigue  . Moxifloxacin Other (See Comments)    Hepatitis  . Sertraline Other (See Comments)  . Sulfa Antibiotics Other (See Comments)    Hallucinations   . Penicillins Itching and Other (See Comments)    pruritus   Objective:  General: Alert and oriented x3 in no acute distress  Dermatology: No open lesions bilateral lower extremities, no webspace macerations, no ecchymosis bilateral, all nails x 10 are well manicured.  Vascular: Dorsalis Pedis and Posterior Tibial pedal pulses 2/4, Capillary Fill Time 3 seconds, + pedal hair growth bilateral, Temperature gradient within normal limits.  Neurology: Gross sensation intact via light touch bilateral. - Tinels sign.   Musculoskeletal: There is tenderness with palpation at lateral insertion of the Achilles extending into the watershed area on Right, there is no calcaneal exostosis but mild soft tissue swelling at lateral portion on the insertion present and  mild decreased ankle rom with knee extending  vs flexed resembling gastroc equnius bilateral, The achilles tendon feels intact with no nodularity or palpable dell, Thompson sign remains negative, Subtalar and midtarsal joint range of motion is within normal limits, there is no 1st ray hypermobility or bunion,+ hammertoe, midfoot exostosis deformity noted bilateral.    Assessment and Plan: Problem List Items Addressed This Visit    None    Visit Diagnoses    Tendonitis, Achilles, right    -  Primary    Right foot pain           -Complete examination performed -Discussed treatement options -Ordered, right ankle MRI to evaluate for acute on chronic changes versus partial tear in setting of increased episode of pain -Strongly recommended anti-inflammatories. Patient states that she has meloxicam 7.5 mg I advised patient to start taking those to systemically help for inflammation that is occurring along this tendon complex. Patient expressed understanding; counseled patient to discontinue if any adverse effects our experience -Patient to slowly transition from cam walker to good supportive sneaker with heel lifts as instructed  -Cont with night splint and ice pack daily until symptoms are completely resolved -Advised patient to refrain from walking/exercise that involves inclines, hills, steps to prevent exacerbation of symptoms; advised patient to limit walking to necessity. -Patient to return to office after MRI or sooner if condition worsens.  Landis Martins, DPM

## 2015-09-02 ENCOUNTER — Other Ambulatory Visit: Payer: Self-pay

## 2015-09-02 ENCOUNTER — Emergency Department: Payer: Medicare Other

## 2015-09-02 ENCOUNTER — Emergency Department
Admission: EM | Admit: 2015-09-02 | Discharge: 2015-09-02 | Disposition: A | Discharge status: Home / Self Care | Payer: Medicare Other | Attending: Emergency Medicine | Admitting: Emergency Medicine

## 2015-09-02 DIAGNOSIS — Z87891 Personal history of nicotine dependence: Secondary | ICD-10-CM | POA: Diagnosis not present

## 2015-09-02 DIAGNOSIS — E785 Hyperlipidemia, unspecified: Secondary | ICD-10-CM | POA: Diagnosis not present

## 2015-09-02 DIAGNOSIS — Z79899 Other long term (current) drug therapy: Secondary | ICD-10-CM | POA: Diagnosis not present

## 2015-09-02 DIAGNOSIS — R0789 Other chest pain: Secondary | ICD-10-CM | POA: Insufficient documentation

## 2015-09-02 DIAGNOSIS — R079 Chest pain, unspecified: Secondary | ICD-10-CM

## 2015-09-02 DIAGNOSIS — Z7982 Long term (current) use of aspirin: Secondary | ICD-10-CM | POA: Diagnosis not present

## 2015-09-02 DIAGNOSIS — I10 Essential (primary) hypertension: Secondary | ICD-10-CM | POA: Insufficient documentation

## 2015-09-02 HISTORY — DX: Hyperlipidemia, unspecified: E78.5

## 2015-09-02 HISTORY — DX: Enthesopathy, unspecified: M77.9

## 2015-09-02 HISTORY — DX: Gastro-esophageal reflux disease without esophagitis: K21.9

## 2015-09-02 HISTORY — DX: Essential (primary) hypertension: I10

## 2015-09-02 LAB — CBC
HCT: 40.4 % (ref 35.0–47.0)
HEMOGLOBIN: 13.6 g/dL (ref 12.0–16.0)
MCH: 28.5 pg (ref 26.0–34.0)
MCHC: 33.6 g/dL (ref 32.0–36.0)
MCV: 84.9 fL (ref 80.0–100.0)
Platelets: 173 10*3/uL (ref 150–440)
RBC: 4.76 MIL/uL (ref 3.80–5.20)
RDW: 14 % (ref 11.5–14.5)
WBC: 9.2 10*3/uL (ref 3.6–11.0)

## 2015-09-02 LAB — HEPATIC FUNCTION PANEL
ALT: 20 U/L (ref 14–54)
AST: 20 U/L (ref 15–41)
Albumin: 4.4 g/dL (ref 3.5–5.0)
Alkaline Phosphatase: 57 U/L (ref 38–126)
BILIRUBIN DIRECT: 0.1 mg/dL (ref 0.1–0.5)
Indirect Bilirubin: 0.7 mg/dL (ref 0.3–0.9)
TOTAL PROTEIN: 7 g/dL (ref 6.5–8.1)
Total Bilirubin: 0.8 mg/dL (ref 0.3–1.2)

## 2015-09-02 LAB — BASIC METABOLIC PANEL
ANION GAP: 10 (ref 5–15)
BUN: 17 mg/dL (ref 6–20)
CALCIUM: 8.8 mg/dL — AB (ref 8.9–10.3)
CO2: 25 mmol/L (ref 22–32)
Chloride: 98 mmol/L — ABNORMAL LOW (ref 101–111)
Creatinine, Ser: 0.95 mg/dL (ref 0.44–1.00)
GFR, EST NON AFRICAN AMERICAN: 59 mL/min — AB (ref >60)
GLUCOSE: 101 mg/dL — AB (ref 65–99)
POTASSIUM: 3.3 mmol/L — AB (ref 3.5–5.1)
SODIUM: 133 mmol/L — AB (ref 135–145)

## 2015-09-02 LAB — TROPONIN I

## 2015-09-02 LAB — LIPASE, BLOOD: LIPASE: 26 U/L (ref 11–51)

## 2015-09-02 MED ORDER — GI COCKTAIL ~~LOC~~
30.0000 mL | Freq: Once | ORAL | Status: AC
  Administered 2015-09-02: 30 mL via ORAL
  Filled 2015-09-02: qty 30

## 2015-09-02 MED ORDER — SUCRALFATE 1 G PO TABS: 1.0000 g | ORAL_TABLET | Freq: Four times a day (QID) | ORAL | Status: DC

## 2015-09-02 NOTE — ED Notes (Signed)
-  pt c/o epigastric into the chest pain/pressure for the past 4-5 days, worse today.. Denies SOB/N/V.Marland Kitchen

## 2015-09-02 NOTE — ED Provider Notes (Signed)
[facility] Emergency Department Provider Note   ____________________________________________  Time seen: ~2115  I have reviewed the triage vital signs and the nursing notes.   HISTORY  Chief Complaint Chest Pain   History limited by: Not Limited   HPI Kerry Jefferson is a 70 y.o. female who presents to the emergency department today because of concerns for a pressure type feeling in her chest. She describes it as being located in the lower central chest. She describes it as a sensation of needing to burp. She states this started roughly 4-5 days ago. It has been fairly constant since then. She states that she has had occasional burps that do not really relieve the symptoms. The patient states that she has not had any shortness of breath, nausea or vomiting with it. No change in defecation. No fevers.    Past Medical History  Diagnosis Date  . Hypertension   . Hyperlipidemia   . GERD (gastroesophageal reflux disease)   . Tendinitis     Patient Active Problem List   Diagnosis Date Noted  . Allergic rhinitis 08/20/2015  . Colon polyp 08/20/2015  . Cyst of right ovary 08/20/2015  . Endometriosis 08/20/2015  . Acid reflux 08/20/2015  . History of colitis 08/20/2015  . H/O viral illness 08/20/2015  . Osteopenia 08/20/2015  . 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 HLD (hyperlipidemia) 07/21/2015  . APC (atrial premature contractions) 07/21/2014  . Essential (primary) hypertension 02/10/2014  . Facial numbness 12/30/2013  . Jerking 12/30/2013  . Arthritis, degenerative 11/17/2013  . Benign essential HTN 11/10/2013  . Cephalalgia 11/10/2013  . Pure hypercholesterolemia 11/10/2013  . Feeling bilious 07/26/2011    Past Surgical History  Procedure Laterality Date  . Breast biopsy Left 2003    negative  . Tonsillectomy    . Cholecystectomy    . Abdominal hysterectomy      Current Outpatient Rx  Name  Route  Sig  Dispense  Refill  .  aspirin EC 81 MG tablet   Oral   Take 1 tablet by mouth daily.         . Cholecalciferol (VITAMIN D-1000 MAX ST) 1000 UNITS tablet   Oral   Take 1 tablet by mouth daily.         . cloNIDine (CATAPRES) 0.1 MG tablet   Oral   Take 1 tablet (0.1 mg total) by mouth 2 (two) times daily as needed.   20 tablet   0   . losartan-hydrochlorothiazide (HYZAAR) 100-12.5 MG per tablet   Oral   Take 1 tablet by mouth daily.          . metaxalone (SKELAXIN) 800 MG tablet   Oral   Take 1 tablet by mouth 3 (three) times daily as needed. Pt has not started this medication yet.         . Omega-3 1000 MG CAPS   Oral   Take 1 capsule by mouth daily.         . simvastatin (ZOCOR) 80 MG tablet   Oral   Take 80 mg by mouth daily at 6 PM.          . TOPROL XL 100 MG 24 hr tablet   Oral   Take 100 mg by mouth daily.            Allergies Atenolol; Ciprofloxacin; Ciprofloxacin hcl; Lisinopril; Moxifloxacin; Sertraline; Sulfa antibiotics; and Penicillins  Family History  Problem Relation Age of Onset  . Breast cancer Maternal Grandmother 45  Social History Social History  Substance Use Topics  . Smoking status: Former Research scientist (life sciences)  . Smokeless tobacco: Never Used     Comment: quit 10 years ago  . Alcohol Use: No    Review of Systems  Constitutional: Negative for fever. Cardiovascular: Positive for chest pressure Respiratory: Negative for shortness of breath. Gastrointestinal: Negative for abdominal pain, vomiting and diarrhea. Neurological: Negative for headaches, focal weakness or numbness.  10-point ROS otherwise negative.  ____________________________________________   PHYSICAL EXAM:  VITAL SIGNS: ED Triage Vitals  Enc Vitals Group     BP 09/02/15 1834 165/68 mmHg     Pulse Rate 09/02/15 1834 82     Resp 09/02/15 1834 18     Temp 09/02/15 1834 98.2 F (36.8 C)     Temp Source 09/02/15 1834 Oral     SpO2 09/02/15 1834 98 %     Weight 09/02/15 1834 190 lb  (86.183 kg)     Height 09/02/15 1834 5' 5"  (1.651 m)     Head Cir --      Peak Flow --      Pain Score 09/02/15 1835 2   Constitutional: Alert and oriented. Well appearing and in no distress. Eyes: Conjunctivae are normal. PERRL. Normal extraocular movements. ENT   Head: Normocephalic and atraumatic.   Nose: No congestion/rhinnorhea.   Mouth/Throat: Mucous membranes are moist.   Neck: No stridor. Hematological/Lymphatic/Immunilogical: No cervical lymphadenopathy. Cardiovascular: Normal rate, regular rhythm.  No murmurs, rubs, or gallops. Respiratory: Normal respiratory effort without tachypnea nor retractions. Breath sounds are clear and equal bilaterally. No wheezes/rales/rhonchi. Gastrointestinal: Soft and nontender. No distention.  Genitourinary: Deferred Musculoskeletal: Normal range of motion in all extremities. No joint effusions.  No lower extremity tenderness nor edema. Neurologic:  Normal speech and language. No gross focal neurologic deficits are appreciated.  Skin:  Skin is warm, dry and intact. No rash noted. Psychiatric: Mood and affect are normal. Speech and behavior are normal. Patient exhibits appropriate insight and judgment.  ____________________________________________    LABS (pertinent positives/negatives)  Labs Reviewed  BASIC METABOLIC PANEL - Abnormal; Notable for the following:    Sodium 133 (*)    Potassium 3.3 (*)    Chloride 98 (*)    Glucose, Bld 101 (*)    Calcium 8.8 (*)    GFR calc non Af Amer 59 (*)    All other components within normal limits  CBC  TROPONIN I  LIPASE, BLOOD  HEPATIC FUNCTION PANEL     ____________________________________________   EKG  I, Nance Pear, attending physician, personally viewed and interpreted this EKG  EKG Time: 1832 Rate: 78 Rhythm: normal sinus rhythm Axis: left axis deviation Intervals: qtc 462 QRS: narrow, LVH ST changes: no st elevation Impression: abnormal  ekg  ____________________________________________    RADIOLOGY  CXR IMPRESSION: Scarring left base. No edema or consolidation  ____________________________________________   PROCEDURES  Procedure(s) performed: None  Critical Care performed: No  ____________________________________________   INITIAL IMPRESSION / ASSESSMENT AND PLAN / ED COURSE  Pertinent labs & imaging results that were available during my care of the patient were reviewed by me and considered in my medical decision making (see chart for details).  Patient resents to the emergency department today because of concerns for a sense of chest pressure. Patient's workup here without concerning findings. I highly doubt ACS given the unusual history, no other symptoms, 4-5 days without an elevation troponin. This point I think GI issues possible. Patient does have a history of GERD.  Will place patient on sucralfate. She states she does have an appointment with GI doctor later this month.  ____________________________________________   FINAL CLINICAL IMPRESSION(S) / ED DIAGNOSES  Final diagnoses:  Chest pain, unspecified chest pain type     Nance Pear, MD 09/02/15 2326

## 2015-09-02 NOTE — Discharge Instructions (Signed)
Please seek medical attention for any high fevers, chest pain, shortness of breath, change in behavior, persistent vomiting, bloody stool or any other new or concerning symptoms. ° ° °Nonspecific Chest Pain °It is often hard to find the cause of chest pain. There is always a chance that your pain could be related to something serious, such as a heart attack or a blood clot in your lungs. Chest pain can also be caused by conditions that are not life-threatening. If you have chest pain, it is very important to follow up with your doctor. ° °HOME CARE °· If you were prescribed an antibiotic medicine, finish it all even if you start to feel better. °· Avoid any activities that cause chest pain. °· Do not use any tobacco products, including cigarettes, chewing tobacco, or electronic cigarettes. If you need help quitting, ask your doctor. °· Do not drink alcohol. °· Take medicines only as told by your doctor. °· Keep all follow-up visits as told by your doctor. This is important. This includes any further testing if your chest pain does not go away. °· Your doctor may tell you to keep your head raised (elevated) while you sleep. °· Make lifestyle changes as told by your doctor. These may include: °¨ Getting regular exercise. Ask your doctor to suggest some activities that are safe for you. °¨ Eating a heart-healthy diet. Your doctor or a diet specialist (dietitian) can help you to learn healthy eating options. °¨ Maintaining a healthy weight. °¨ Managing diabetes, if necessary. °¨ Reducing stress. °GET HELP IF: °· Your chest pain does not go away, even after treatment. °· You have a rash with blisters on your chest. °· You have a fever. °GET HELP RIGHT AWAY IF: °· Your chest pain is worse. °· You have an increasing cough, or you cough up blood. °· You have severe belly (abdominal) pain. °· You feel extremely weak. °· You pass out (faint). °· You have chills. °· You have sudden, unexplained chest discomfort. °· You have  sudden, unexplained discomfort in your arms, back, neck, or jaw. °· You have shortness of breath at any time. °· You suddenly start to sweat, or your skin gets clammy. °· You feel nauseous. °· You vomit. °· You suddenly feel light-headed or dizzy. °· Your heart begins to beat quickly, or it feels like it is skipping beats. °These symptoms may be an emergency. Do not wait to see if the symptoms will go away. Get medical help right away. Call your local emergency services (911 in the [place] Do not drive yourself to the hospital. °  °This information is not intended to replace advice given to you by your health care provider. Make sure you discuss any questions you have with your health care provider. °  °Document Released: 10/25/2007 Document Revised: 05/29/2014 Document Reviewed: 12/12/2013 °Elsevier Interactive Patient Education ©2016 [employer] ° °

## 2015-09-07 ENCOUNTER — Emergency Department
Admission: EM | Admit: 2015-09-07 | Discharge: 2015-09-07 | Disposition: A | Discharge status: Home / Self Care | Payer: Medicare Other | Attending: Student | Admitting: Student

## 2015-09-07 ENCOUNTER — Encounter: Payer: Self-pay | Admitting: Emergency Medicine

## 2015-09-07 DIAGNOSIS — Z881 Allergy status to other antibiotic agents status: Secondary | ICD-10-CM | POA: Insufficient documentation

## 2015-09-07 DIAGNOSIS — I10 Essential (primary) hypertension: Secondary | ICD-10-CM | POA: Insufficient documentation

## 2015-09-07 DIAGNOSIS — Z880 Allergy status to penicillin: Secondary | ICD-10-CM | POA: Insufficient documentation

## 2015-09-07 DIAGNOSIS — Y93G1 Activity, food preparation and clean up: Secondary | ICD-10-CM | POA: Insufficient documentation

## 2015-09-07 DIAGNOSIS — Y92000 Kitchen of unspecified non-institutional (private) residence as  the place of occurrence of the external cause: Secondary | ICD-10-CM | POA: Diagnosis not present

## 2015-09-07 DIAGNOSIS — Z87891 Personal history of nicotine dependence: Secondary | ICD-10-CM | POA: Insufficient documentation

## 2015-09-07 DIAGNOSIS — M1990 Unspecified osteoarthritis, unspecified site: Secondary | ICD-10-CM | POA: Diagnosis not present

## 2015-09-07 DIAGNOSIS — Z9049 Acquired absence of other specified parts of digestive tract: Secondary | ICD-10-CM | POA: Diagnosis not present

## 2015-09-07 DIAGNOSIS — Z7982 Long term (current) use of aspirin: Secondary | ICD-10-CM | POA: Insufficient documentation

## 2015-09-07 DIAGNOSIS — S61210A Laceration without foreign body of right index finger without damage to nail, initial encounter: Secondary | ICD-10-CM | POA: Diagnosis present

## 2015-09-07 DIAGNOSIS — W25XXXA Contact with sharp glass, initial encounter: Secondary | ICD-10-CM | POA: Diagnosis not present

## 2015-09-07 DIAGNOSIS — Z888 Allergy status to other drugs, medicaments and biological substances status: Secondary | ICD-10-CM | POA: Insufficient documentation

## 2015-09-07 DIAGNOSIS — Z90710 Acquired absence of both cervix and uterus: Secondary | ICD-10-CM | POA: Diagnosis not present

## 2015-09-07 DIAGNOSIS — Z79899 Other long term (current) drug therapy: Secondary | ICD-10-CM | POA: Insufficient documentation

## 2015-09-07 DIAGNOSIS — Y999 Unspecified external cause status: Secondary | ICD-10-CM | POA: Diagnosis not present

## 2015-09-07 DIAGNOSIS — E785 Hyperlipidemia, unspecified: Secondary | ICD-10-CM | POA: Insufficient documentation

## 2015-09-07 NOTE — ED Notes (Signed)
Lac to right hand, top of index finger.  Bleeding controlled.

## 2015-09-07 NOTE — ED Provider Notes (Signed)
[facility] Emergency Department Provider Note  ____________________________________________  Time seen: Approximately 11:29 AM  I have reviewed the triage vital signs and the nursing notes.   HISTORY  Chief Complaint Laceration   HPI Kerry Jefferson is a 70 y.o. female is here with laceration to her right hand. Patient states she was washing a glass that was cracked at the topwhich cut her hand. Patient is up-to-date on her immunizations. She denies any difficulty with range of motion or sensation. She rates her pain as a 1/10.   Past Medical History  Diagnosis Date  . Hypertension   . Hyperlipidemia   . GERD (gastroesophageal reflux disease)   . Tendinitis     Patient Active Problem List   Diagnosis Date Noted  . Allergic rhinitis 08/20/2015  . Colon polyp 08/20/2015  . Cyst of right ovary 08/20/2015  . Endometriosis 08/20/2015  . Acid reflux 08/20/2015  . History of colitis 08/20/2015  . H/O viral illness 08/20/2015  . Osteopenia 08/20/2015  . 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 HLD (hyperlipidemia) 07/21/2015  . APC (atrial premature contractions) 07/21/2014  . Essential (primary) hypertension 02/10/2014  . Facial numbness 12/30/2013  . Jerking 12/30/2013  . Arthritis, degenerative 11/17/2013  . Benign essential HTN 11/10/2013  . Cephalalgia 11/10/2013  . Pure hypercholesterolemia 11/10/2013  . Feeling bilious 07/26/2011    Past Surgical History  Procedure Laterality Date  . Breast biopsy Left 2003    negative  . Tonsillectomy    . Cholecystectomy    . Abdominal hysterectomy      Current Outpatient Rx  Name  Route  Sig  Dispense  Refill  . aspirin EC 81 MG tablet   Oral   Take 1 tablet by mouth daily.         . Cholecalciferol (VITAMIN D-1000 MAX ST) 1000 UNITS tablet   Oral   Take 1 tablet by mouth daily.         . cloNIDine (CATAPRES) 0.1 MG tablet   Oral   Take 1 tablet (0.1 mg total) by mouth 2 (two)  times daily as needed.   20 tablet   0   . losartan-hydrochlorothiazide (HYZAAR) 100-12.5 MG per tablet   Oral   Take 1 tablet by mouth daily.          . metaxalone (SKELAXIN) 800 MG tablet   Oral   Take 1 tablet by mouth 3 (three) times daily as needed. Pt has not started this medication yet.         . Omega-3 1000 MG CAPS   Oral   Take 1 capsule by mouth daily.         . simvastatin (ZOCOR) 80 MG tablet   Oral   Take 80 mg by mouth daily at 6 PM.          . sucralfate (CARAFATE) 1 g tablet   Oral   Take 1 tablet (1 g total) by mouth 4 (four) times daily.   60 tablet   0   . TOPROL XL 100 MG 24 hr tablet   Oral   Take 100 mg by mouth daily.            Allergies Atenolol; Ciprofloxacin; Ciprofloxacin hcl; Lisinopril; Moxifloxacin; Sertraline; Sulfa antibiotics; and Penicillins  Family History  Problem Relation Age of Onset  . Breast cancer Maternal Grandmother 80    Social History Social History  Substance Use Topics  . Smoking status: Former Research scientist (life sciences)  . Smokeless tobacco:  Never Used     Comment: quit 10 years ago  . Alcohol Use: No    Review of Systems Constitutional: No fever/chills Cardiovascular: Denies chest pain. Respiratory: Denies shortness of breath. Gastrointestinal:  No nausea, no vomiting.   Musculoskeletal: Negative for hand pain. Skin: Positive for laceration right hand Neurological: Negative for headaches, focal weakness or numbness.  10-point ROS otherwise negative.  ____________________________________________   PHYSICAL EXAM:  VITAL SIGNS: ED Triage Vitals  Enc Vitals Group     BP --      Pulse --      Resp --      Temp --      Temp src --      SpO2 --      Weight --      Height --      Head Cir --      Peak Flow --      Pain Score 09/07/15 1028 1     Pain Loc --      Pain Edu? --      Excl. in [place]? --     Constitutional: Alert and oriented. Well appearing and in no acute distress. Eyes: Conjunctivae are  normal. PERRL. EOMI. Head: Atraumatic. Nose: No congestion/rhinnorhea. Neck: No stridor.   Cardiovascular: Normal rate, regular rhythm. Grossly normal heart sounds.  Good peripheral circulation. Respiratory: Normal respiratory effort.  No retractions. Lungs CTAB. Musculoskeletal: There is superficial laceration across the MP joint of the right index finger dorsal aspect. No active bleeding was noted. There is no foreign bodies present. She is able to flex and extend. Motor sensory function intact. Capillary refill less than 3 seconds. Neurologic:  Normal speech and language. No gross focal neurologic deficits are appreciated. No gait instability. Skin:  Skin is warm, dry and intact. Laceration is noted above. Psychiatric: Mood and affect are normal. Speech and behavior are normal.  ____________________________________________   LABS (all labs ordered are listed, but only abnormal results are displayed)  Labs Reviewed - No data to display   PROCEDURES  Procedure(s) performed: LACERATION REPAIR Performed by: Gweneth Fritter PA-S Authorized by: Johnn Hai Consent: Verbal consent obtained. Risks and benefits: risks, benefits and alternatives were discussed Consent given by: patient Patient identity confirmed: provided demographic data Wound explored  Laceration Location: right MP joint dorsal aspect  Laceration Length: 2.0 cm  No Foreign Bodies seen or palpated  Irrigation method: syringe Amount of cleaning: standard  Skin closure: With combination of Dermabond and Steri-Strips.   Technique: As above   Patient tolerance: Patient tolerated the procedure well with no immediate complications.  Critical Care performed: No  ____________________________________________   INITIAL IMPRESSION / ASSESSMENT AND PLAN / ED COURSE  Pertinent labs & imaging results that were available during my care of the patient were reviewed by me and considered in my medical decision making  (see chart for details).  Patient was given information about wound care and will follow-up with her primary care doctor if any continued problems or signs of infection. ____________________________________________   FINAL CLINICAL IMPRESSION(S) / ED DIAGNOSES  Final diagnoses:  Laceration of right index finger w/o foreign body w/o damage to nail, initial encounter      Johnn Hai, PA-C 09/07/15 [place] Gay[place], MD 09/07/15 [phone]

## 2015-09-07 NOTE — Discharge Instructions (Signed)
Follow-up with Dr. Doy Hutching if any continued problems or urgent concerns.

## 2015-09-07 NOTE — ED Notes (Signed)
See triage note  Laceration to right hand/finger  Bleeding controlled

## 2015-09-15 ENCOUNTER — Ambulatory Visit
Admission: RE | Admit: 2015-09-15 | Discharge: 2015-09-15 | Disposition: A | Discharge status: Home / Self Care | Payer: Medicare Other | Source: Ambulatory Visit | Attending: Sports Medicine | Admitting: Sports Medicine

## 2015-09-15 DIAGNOSIS — T148XXA Other injury of unspecified body region, initial encounter: Secondary | ICD-10-CM

## 2015-09-15 DIAGNOSIS — M722 Plantar fascial fibromatosis: Secondary | ICD-10-CM | POA: Diagnosis not present

## 2015-09-15 DIAGNOSIS — M7661 Achilles tendinitis, right leg: Secondary | ICD-10-CM | POA: Insufficient documentation

## 2015-09-15 DIAGNOSIS — M659 Synovitis and tenosynovitis, unspecified: Secondary | ICD-10-CM | POA: Diagnosis not present

## 2015-09-15 DIAGNOSIS — M19071 Primary osteoarthritis, right ankle and foot: Secondary | ICD-10-CM | POA: Diagnosis not present

## 2015-09-15 DIAGNOSIS — R601 Generalized edema: Secondary | ICD-10-CM | POA: Insufficient documentation

## 2015-09-21 ENCOUNTER — Encounter: Payer: Self-pay | Admitting: Sports Medicine

## 2015-09-21 ENCOUNTER — Ambulatory Visit (INDEPENDENT_AMBULATORY_CARE_PROVIDER_SITE_OTHER): Payer: Medicare Other | Admitting: Sports Medicine

## 2015-09-21 DIAGNOSIS — M7661 Achilles tendinitis, right leg: Secondary | ICD-10-CM | POA: Diagnosis not present

## 2015-09-21 DIAGNOSIS — M79671 Pain in right foot: Secondary | ICD-10-CM

## 2015-09-21 MED ORDER — METHYLPREDNISOLONE 4 MG PO TBPK: ORAL_TABLET | ORAL | Status: DC

## 2015-09-21 MED ORDER — MELOXICAM 15 MG PO TABS: 15.0000 mg | ORAL_TABLET | Freq: Every day | ORAL | Status: DC

## 2015-09-21 NOTE — Progress Notes (Signed)
Patient ID: Kerry Jefferson, female   DOB: 19-May-1946, 70 y.o.   MRN: [id number]  Subjective: Kerry Jefferson is a 70 y.o. female patient who returns to office for MRI review and re-evaluation of Right posterior heel pain. Patient states that she feels about the same as last visit with no significant improvement; at insertion of achilles area still hurts when directly pressed; reports there was 1 day that she had to go back to using CAM boot otherwise she has been tolerating the heel lift and a good shoe fine with flare ups requiring boot.  Patient denies any other pedal complaints.   Patient Active Problem List   Diagnosis Date Noted  . Allergic rhinitis 08/20/2015  . Colon polyp 08/20/2015  . Cyst of right ovary 08/20/2015  . Endometriosis 08/20/2015  . Acid reflux 08/20/2015  . History of colitis 08/20/2015  . H/O viral illness 08/20/2015  . Osteopenia 08/20/2015  . 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 HLD (hyperlipidemia) 07/21/2015  . APC (atrial premature contractions) 07/21/2014  . Essential (primary) hypertension 02/10/2014  . Facial numbness 12/30/2013  . Jerking 12/30/2013  . Arthritis, degenerative 11/17/2013  . Benign essential HTN 11/10/2013  . Cephalalgia 11/10/2013  . Pure hypercholesterolemia 11/10/2013  . Feeling bilious 07/26/2011   Current Outpatient Prescriptions on File Prior to Visit  Medication Sig Dispense Refill  . aspirin EC 81 MG tablet Take 1 tablet by mouth daily.    . Cholecalciferol (VITAMIN D-1000 MAX ST) 1000 UNITS tablet Take 1 tablet by mouth daily.    . cloNIDine (CATAPRES) 0.1 MG tablet Take 1 tablet (0.1 mg total) by mouth 2 (two) times daily as needed. 20 tablet 0  . losartan-hydrochlorothiazide (HYZAAR) 100-12.5 MG per tablet Take 1 tablet by mouth daily.     . metaxalone (SKELAXIN) 800 MG tablet Take 1 tablet by mouth 3 (three) times daily as needed. Pt has not started this medication yet.    . Omega-3 1000 MG CAPS Take 1 capsule by mouth  daily.    . simvastatin (ZOCOR) 80 MG tablet Take 80 mg by mouth daily at 6 PM.     . sucralfate (CARAFATE) 1 g tablet Take 1 tablet (1 g total) by mouth 4 (four) times daily. 60 tablet 0  . TOPROL XL 100 MG 24 hr tablet Take 100 mg by mouth daily.      No current facility-administered medications on file prior to visit.   Allergies  Allergen Reactions  . Atenolol Other (See Comments)    fatigue  . Ciprofloxacin Other (See Comments)    Caused drug induced hepatitis   . Ciprofloxacin Hcl Other (See Comments)    Hepatitis  . Lisinopril Other (See Comments)    Fatigue  . Moxifloxacin Other (See Comments)    Hepatitis  . Sertraline Other (See Comments)  . Sulfa Antibiotics Other (See Comments)    Hallucinations   . Penicillins Itching and Other (See Comments)    pruritus   Objective:  General: Alert and oriented x3 in no acute distress  Dermatology: No open lesions bilateral lower extremities, no webspace macerations, no ecchymosis bilateral, all nails x 10 are well manicured.  Vascular: Dorsalis Pedis and Posterior Tibial pedal pulses 2/4, Capillary Fill Time 3 seconds, + pedal hair growth bilateral, Temperature gradient within normal limits.  Neurology: Gross sensation intact via light touch bilateral. - Tinels sign.   Musculoskeletal: There is tenderness with palpation at central and lateral insertion of the Achilles extending into the watershed area  on Right, there is no calcaneal exostosis but mild soft tissue swelling at lateral portion on the insertion present and mild decreased ankle rom with knee extending  vs flexed resembling gastroc equnius bilateral, The achilles tendon feels intact with no nodularity or palpable dell, Thompson sign remains negative, Subtalar and midtarsal joint range of motion is within normal limits, there is no 1st ray hypermobility or bunion,+ hammertoe, midfoot exostosis deformity noted bilateral.    MRI- achilles tendonitis, plantar fasciitis, pes  planus with PT tendonitis and arthritis  Assessment and Plan: Problem List Items Addressed This Visit    None    Visit Diagnoses    Tendonitis, Achilles, right    -  Primary    Relevant Medications    methylPREDNISolone (MEDROL DOSEPAK) 4 MG TBPK tablet    meloxicam (MOBIC) 15 MG tablet    Right foot pain        Relevant Medications    methylPREDNISolone (MEDROL DOSEPAK) 4 MG TBPK tablet    meloxicam (MOBIC) 15 MG tablet      -Complete examination performed -Discussed treatement options for tendonitis and fascitis  -Patient would like to try another round of medication before deciding on EPAT -Rx Mobic 15 mg to start after Medrol dose pack  -Cont with good supportive sneaker with heel lifts as instructed and CAM walker as needed for acute pain -Cont with night splint as tolerated and ice pack daily until symptoms are completely resolved -Advised patient to refrain from walking/exercise that involves inclines, hills, steps to prevent exacerbation of symptoms; advised patient to limit walking to necessity. -Patient to return to office in 3 weeks for follow up eval and to further discuss EPAT if pain is not improved or sooner if condition worsens.  Landis Martins, DPM

## 2015-09-28 ENCOUNTER — Other Ambulatory Visit: Payer: Self-pay | Admitting: Internal Medicine

## 2015-09-28 DIAGNOSIS — Z1231 Encounter for screening mammogram for malignant neoplasm of breast: Secondary | ICD-10-CM

## 2015-10-12 ENCOUNTER — Ambulatory Visit: Payer: Medicare Other | Admitting: Sports Medicine

## 2015-10-21 ENCOUNTER — Encounter: Payer: Self-pay | Admitting: *Deleted

## 2015-10-22 ENCOUNTER — Ambulatory Visit
Admission: RE | Admit: 2015-10-22 | Discharge: 2015-10-22 | Disposition: A | Discharge status: Home / Self Care | Payer: Medicare Other | Source: Ambulatory Visit | Attending: Unknown Physician Specialty | Admitting: Unknown Physician Specialty

## 2015-10-22 ENCOUNTER — Encounter
Admission: RE | Disposition: A | Discharge status: Home / Self Care | Payer: Self-pay | Source: Ambulatory Visit | Attending: Unknown Physician Specialty

## 2015-10-22 ENCOUNTER — Encounter: Payer: Self-pay | Admitting: *Deleted

## 2015-10-22 ENCOUNTER — Ambulatory Visit: Payer: Medicare Other | Admitting: Anesthesiology

## 2015-10-22 DIAGNOSIS — K219 Gastro-esophageal reflux disease without esophagitis: Secondary | ICD-10-CM | POA: Diagnosis not present

## 2015-10-22 DIAGNOSIS — K5730 Diverticulosis of large intestine without perforation or abscess without bleeding: Secondary | ICD-10-CM | POA: Diagnosis not present

## 2015-10-22 DIAGNOSIS — I10 Essential (primary) hypertension: Secondary | ICD-10-CM | POA: Diagnosis not present

## 2015-10-22 DIAGNOSIS — D122 Benign neoplasm of ascending colon: Secondary | ICD-10-CM | POA: Diagnosis not present

## 2015-10-22 DIAGNOSIS — Z1211 Encounter for screening for malignant neoplasm of colon: Secondary | ICD-10-CM | POA: Diagnosis not present

## 2015-10-22 DIAGNOSIS — E785 Hyperlipidemia, unspecified: Secondary | ICD-10-CM | POA: Diagnosis not present

## 2015-10-22 DIAGNOSIS — Z79899 Other long term (current) drug therapy: Secondary | ICD-10-CM | POA: Insufficient documentation

## 2015-10-22 DIAGNOSIS — Z86010 Personal history of colonic polyps: Secondary | ICD-10-CM | POA: Diagnosis not present

## 2015-10-22 DIAGNOSIS — D127 Benign neoplasm of rectosigmoid junction: Secondary | ICD-10-CM | POA: Diagnosis not present

## 2015-10-22 DIAGNOSIS — K640 First degree hemorrhoids: Secondary | ICD-10-CM | POA: Diagnosis not present

## 2015-10-22 DIAGNOSIS — Z7982 Long term (current) use of aspirin: Secondary | ICD-10-CM | POA: Insufficient documentation

## 2015-10-22 HISTORY — PX: COLONOSCOPY WITH PROPOFOL: SHX5780

## 2015-10-22 SURGERY — COLONOSCOPY WITH PROPOFOL
Anesthesia: General

## 2015-10-22 MED ORDER — PROPOFOL 500 MG/50ML IV EMUL
INTRAVENOUS | Status: DC | PRN
  Administered 2015-10-22: 120 ug/kg/min via INTRAVENOUS

## 2015-10-22 MED ORDER — SODIUM CHLORIDE 0.9 % IV SOLN
INTRAVENOUS | Status: DC
  Administered 2015-10-22: 10:00:00 via INTRAVENOUS

## 2015-10-22 MED ORDER — LIDOCAINE 2% (20 MG/ML) 5 ML SYRINGE
INTRAMUSCULAR | Status: DC | PRN
  Administered 2015-10-22: 50 mg via INTRAVENOUS

## 2015-10-22 MED ORDER — SODIUM CHLORIDE 0.9 % IV SOLN: INTRAVENOUS | Status: DC

## 2015-10-22 MED ORDER — MIDAZOLAM HCL 5 MG/5ML IJ SOLN
INTRAMUSCULAR | Status: DC | PRN
  Administered 2015-10-22: 1 mg via INTRAVENOUS

## 2015-10-22 MED ORDER — PROPOFOL 10 MG/ML IV BOLUS
INTRAVENOUS | Status: DC | PRN
  Administered 2015-10-22: 70 mg via INTRAVENOUS
  Administered 2015-10-22: 30 mg via INTRAVENOUS

## 2015-10-22 NOTE — Anesthesia Postprocedure Evaluation (Signed)
Anesthesia Post Note  Patient: Kerry Jefferson  Procedure(s) Performed: Procedure(s) (LRB): COLONOSCOPY WITH PROPOFOL (N/A)  Patient location during evaluation: Endoscopy Anesthesia Type: General Level of consciousness: awake and alert Pain management: pain level controlled Vital Signs Assessment: post-procedure vital signs reviewed and stable Respiratory status: spontaneous breathing, nonlabored ventilation, respiratory function stable and patient connected to nasal cannula oxygen Cardiovascular status: blood pressure returned to baseline and stable Postop Assessment: no signs of nausea or vomiting Anesthetic complications: no    Last Vitals:  Filed Vitals:   10/22/15 1125 10/22/15 1135  BP: 129/70 139/67  Pulse: 59 53  Temp:    Resp: 15 14    Last Pain: There were no vitals filed for this visit.               Sayyid Harewood S

## 2015-10-22 NOTE — Transfer of Care (Signed)
Immediate Anesthesia Transfer of Care Note  Patient: Kerry Jefferson  Procedure(s) Performed: Procedure(s): COLONOSCOPY WITH PROPOFOL (N/A)  Patient Location: Endoscopy Unit  Anesthesia Type:General  Level of Consciousness: awake and alert   Airway & Oxygen Therapy: Patient Spontanous Breathing and Patient connected to nasal cannula oxygen  Post-op Assessment: Report given to RN  Post vital signs: Reviewed  Last Vitals:  Filed Vitals:   10/22/15 0939 10/22/15 1105  BP: 173/82 97/50  Pulse: 72 62  Temp: 37.3 C 35.9 C  Resp: 22 18    Last Pain: There were no vitals filed for this visit.       Complications: No apparent anesthesia complications

## 2015-10-22 NOTE — Anesthesia Preprocedure Evaluation (Signed)
Anesthesia Evaluation  Patient identified by MRN, date of birth, ID band Patient awake    Reviewed: Allergy & Precautions, NPO status , Patient's Chart, lab work & pertinent test results, reviewed documented beta blocker date and time   Airway Mallampati: II  TM Distance: >3 FB     Dental  (+) Chipped   Pulmonary former smoker,           Cardiovascular hypertension, Pt. on medications and Pt. on home beta blockers      Neuro/Psych  Headaches,    GI/Hepatic GERD  ,  Endo/Other    Renal/GU      Musculoskeletal  (+) Arthritis ,   Abdominal   Peds  Hematology   Anesthesia Other Findings   Reproductive/Obstetrics                             Anesthesia Physical Anesthesia Plan  ASA: III  Anesthesia Plan: General   Post-op Pain Management:    Induction: Intravenous  Airway Management Planned: Nasal Cannula  Additional Equipment:   Intra-op Plan:   Post-operative Plan:   Informed Consent: I have reviewed the patients History and Physical, chart, labs and discussed the procedure including the risks, benefits and alternatives for the proposed anesthesia with the patient or authorized representative who has indicated his/her understanding and acceptance.     Plan Discussed with: CRNA  Anesthesia Plan Comments:         Anesthesia Quick Evaluation

## 2015-10-22 NOTE — Op Note (Signed)
[facility] Patient Name: Kerry Jefferson Procedure Date: 10/22/2015 10:27 AM MRN: [id number] Account #: [id number] Date of Birth: November 22, 1945 Admit Type: Outpatient Age: 70 Room: [facility] ENDO ROOM 4 Gender: Female Note Status: Finalized Procedure:            Colonoscopy Indications:          High risk colon cancer surveillance: Personal history                        of colonic polyps Providers:            Manya Silvas, MD Referring MD:         Leonie Douglas. Doy Hutching, MD (Referring MD) Medicines:            Propofol per Anesthesia Complications:        No immediate complications. Procedure:            Pre-Anesthesia Assessment:                       - After reviewing the risks and benefits, the patient                        was deemed in satisfactory condition to undergo the                        procedure.                       After obtaining informed consent, the colonoscope was                        passed under direct vision. Throughout the procedure,                        the patient's blood pressure, pulse, and oxygen                        saturations were monitored continuously. The                        Colonoscope was introduced through the anus and                        advanced to the the cecum, identified by appendiceal                        orifice and ileocecal valve. The colonoscopy was                        performed without difficulty. The patient tolerated the                        procedure well. The quality of the bowel preparation                        was excellent. Findings:      A diminutive polyp was found in the recto-sigmoid colon. The polyp was       sessile. The polyp was removed with a jumbo cold forceps. Resection and       retrieval were complete.      Three sessile polyps were  found in the ascending colon. The polyps were       diminutive in size. These polyps were removed with a jumbo cold forceps.   Resection and retrieval were complete.      Multiple small and large-mouthed diverticula were found in the sigmoid       colon, descending colon, transverse colon and ascending colon.      Internal hemorrhoids were found during endoscopy. The hemorrhoids were       small and Grade I (internal hemorrhoids that do not prolapse).      The exam was otherwise without abnormality. Impression:           - One diminutive polyp at the recto-sigmoid colon,                        removed with a jumbo cold forceps. Resected and                        retrieved.                       - Three diminutive polyps in the ascending colon,                        removed with a jumbo cold forceps. Resected and                        retrieved.                       - Diverticulosis in the sigmoid colon, in the                        descending colon, in the transverse colon and in the                        ascending colon.                       - Internal hemorrhoids.                       - The examination was otherwise normal. Recommendation:       - Await pathology results. Manya Silvas, MD 10/22/2015 11:04:40 AM This report has been signed electronically. Number of Addenda: 0 Note Initiated On: 10/22/2015 10:27 AM Scope Withdrawal Time: 0 hours 14 minutes 41 seconds  Total Procedure Duration: 0 hours 25 minutes 53 seconds       [facility]

## 2015-10-22 NOTE — H&P (Signed)
Primary Care Physician:  Idelle Crouch, MD Primary Gastroenterologist:  Dr. Vira Agar  Pre-Procedure History & Physical: HPI:  Kerry Jefferson is a 70 y.o. female is here for an colonoscopy.   Past Medical History  Diagnosis Date  . Hypertension   . Hyperlipidemia   . GERD (gastroesophageal reflux disease)   . Tendinitis     Past Surgical History  Procedure Laterality Date  . Breast biopsy Left 2003    negative  . Tonsillectomy    . Cholecystectomy    . Abdominal hysterectomy      Prior to Admission medications   Medication Sig Start Date End Date Taking? Authorizing Provider  Cholecalciferol (VITAMIN D-1000 MAX ST) 1000 UNITS tablet Take 1 tablet by mouth daily.   Yes Historical Provider, MD  losartan-hydrochlorothiazide (HYZAAR) 100-12.5 MG per tablet Take 1 tablet by mouth daily.  04/07/13  Yes Historical Provider, MD  Omega-3 1000 MG CAPS Take 1 capsule by mouth daily. 05/24/11  Yes Historical Provider, MD  simvastatin (ZOCOR) 80 MG tablet Take 80 mg by mouth daily at 6 PM.  04/07/13  Yes Historical Provider, MD  sucralfate (CARAFATE) 1 g tablet Take 1 tablet (1 g total) by mouth 4 (four) times daily. 09/02/15  Yes Nance Pear, MD  TOPROL XL 100 MG 24 hr tablet Take 100 mg by mouth daily.  03/04/13  Yes Historical Provider, MD  aspirin EC 81 MG tablet Take 1 tablet by mouth daily.    Historical Provider, MD  cloNIDine (CATAPRES) 0.1 MG tablet Take 1 tablet (0.1 mg total) by mouth 2 (two) times daily as needed. 04/06/15 04/05/16  Daymon Larsen, MD  meloxicam (MOBIC) 15 MG tablet Take 1 tablet (15 mg total) by mouth daily. 09/21/15   Landis Martins, DPM  metaxalone (SKELAXIN) 800 MG tablet Take 1 tablet by mouth 3 (three) times daily as needed. Pt has not started this medication yet. 03/30/15   Historical Provider, MD  methylPREDNISolone (MEDROL DOSEPAK) 4 MG TBPK tablet Take as instructed 09/21/15   Landis Martins, DPM    Allergies as of 10/11/2015 - Review Complete  09/21/2015  Allergen Reaction Noted  . Atenolol Other (See Comments) 06/15/2015  . Ciprofloxacin Other (See Comments) 04/29/2013  . Ciprofloxacin hcl Other (See Comments) 06/15/2015  . Lisinopril Other (See Comments) 06/15/2015  . Moxifloxacin Other (See Comments) 06/15/2015  . Sertraline Other (See Comments) 06/15/2015  . Sulfa antibiotics Other (See Comments) 04/29/2013  . Penicillins Itching and Other (See Comments) 04/29/2013    Family History  Problem Relation Age of Onset  . Breast cancer Maternal Grandmother 80    Social History   Social History  . Marital Status: Married    Spouse Name: N/A  . Number of Children: N/A  . Years of Education: N/A   Occupational History  . Not on file.   Social History Main Topics  . Smoking status: Former Research scientist (life sciences)  . Smokeless tobacco: Never Used     Comment: quit 10 years ago  . Alcohol Use: No  . Drug Use: No  . Sexual Activity: Not on file   Other Topics Concern  . Not on file   Social History Narrative    Review of Systems: See HPI, otherwise negative ROS  Physical Exam: BP 173/82 mmHg  Pulse 72  Temp(Src) 99.1 F (37.3 C) (Tympanic)  Resp 22  Ht 5' 5"  (1.651 m)  Wt 86.183 kg (190 lb)  BMI 31.62 kg/m2  SpO2 97% General:   Alert,  pleasant and cooperative in NAD Head:  Normocephalic and atraumatic. Neck:  Supple; no masses or thyromegaly. Lungs:  Clear throughout to auscultation.    Heart:  Regular rate and rhythm. Abdomen:  Soft, nontender and nondistended. Normal bowel sounds, without guarding, and without rebound.   Neurologic:  Alert and  oriented x4;  grossly normal neurologically.  Impression/Plan: Kerry Jefferson is here for an colonoscopy to be performed for [facility] colon polyps  Risks, benefits, limitations, and alternatives regarding  colonoscopy have been reviewed with the patient.  Questions have been answered.  All parties agreeable.   Kerry Cheers, MD  10/22/2015, 10:30 AM

## 2015-10-25 ENCOUNTER — Encounter: Payer: Self-pay | Admitting: Unknown Physician Specialty

## 2015-10-26 LAB — SURGICAL PATHOLOGY

## 2015-11-18 ENCOUNTER — Ambulatory Visit
Admission: RE | Admit: 2015-11-18 | Discharge: 2015-11-18 | Disposition: A | Discharge status: Home / Self Care | Payer: Medicare Other | Source: Ambulatory Visit | Attending: Internal Medicine | Admitting: Internal Medicine

## 2015-11-18 ENCOUNTER — Other Ambulatory Visit: Payer: Self-pay | Admitting: Internal Medicine

## 2015-11-18 DIAGNOSIS — Z1231 Encounter for screening mammogram for malignant neoplasm of breast: Secondary | ICD-10-CM

## 2015-12-17 ENCOUNTER — Telehealth: Payer: Self-pay | Admitting: Sports Medicine

## 2015-12-17 NOTE — Telephone Encounter (Signed)
Mrs. Helser called in about her Medical Records that was suppose to be send to [facility] orthopedic she wanted to know the status

## 2015-12-17 NOTE — Telephone Encounter (Signed)
I called and spoke to patient. She stated the records are supposed to be faxed to [facility] and not [facility] [facility].

## 2016-11-28 ENCOUNTER — Other Ambulatory Visit: Payer: Self-pay | Admitting: Internal Medicine

## 2016-11-28 DIAGNOSIS — Z1231 Encounter for screening mammogram for malignant neoplasm of breast: Secondary | ICD-10-CM

## 2016-12-04 IMAGING — MR MR ANKLE*R* W/O CM
6 series · 40 of 40 positions shown · non-contrast
Comparison: 06/15/2015

CLINICAL DATA: Heel pain starting in [REDACTED] after treadmill
exercise. Conservative therapy has not helped.

EXAM:
MRI OF THE RIGHT ANKLE WITHOUT CONTRAST
TECHNIQUE: Multiplanar, multisequence MR imaging of the ankle was performed. No
intravenous contrast was administered.

[Series 3: PD fat-sat · axial · 3.0mm · 0.50mm/px · z∈[-121,-5]mm · 8 of 36 slices shown]
[im 1/36]
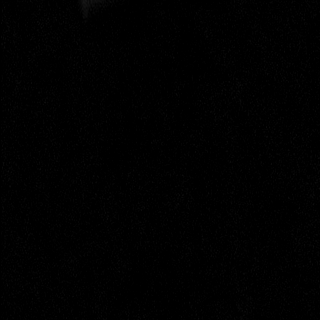
[im 6/36]
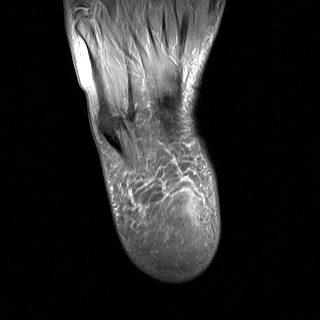
[im 11/36]
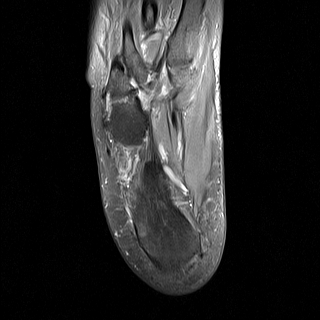
[im 16/36]
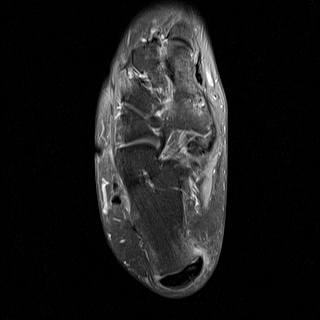
[im 21/36]
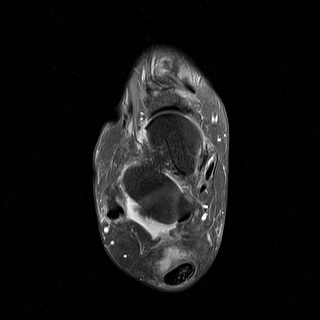
[im 26/36]
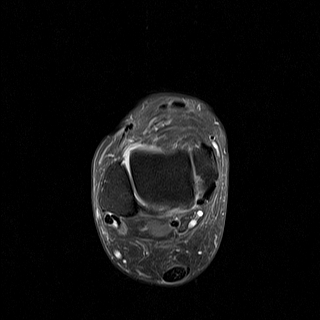
[im 31/36]
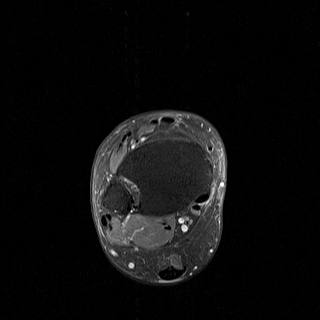
[im 36/36]
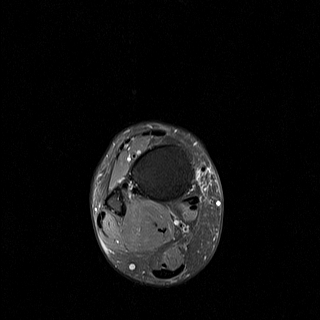

[Series 4: T2 fat-sat · axial · 3.0mm · 0.50mm/px · z∈[-121,-5]mm · 8 of 36 slices shown (1 of 3)]
[im 1/36]
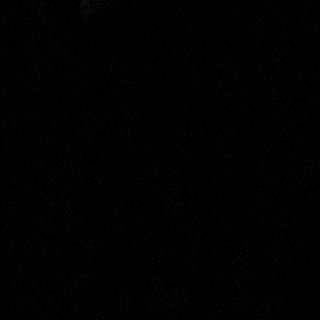
[im 6/36]
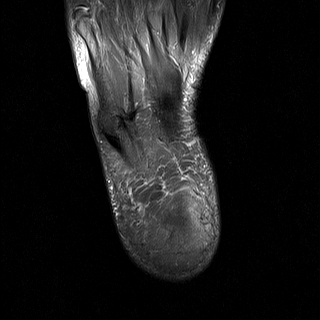
[im 11/36]
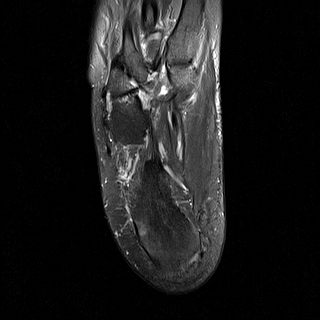
[im 16/36]
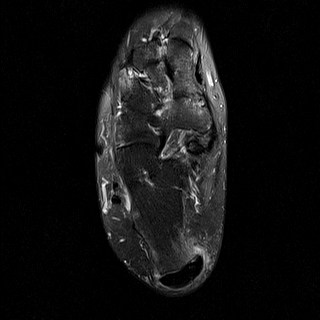
[im 21/36]
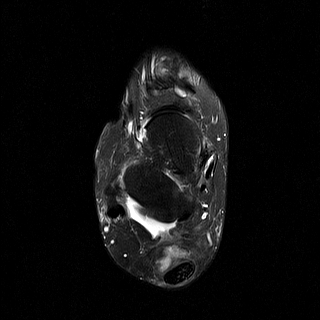
[im 26/36]
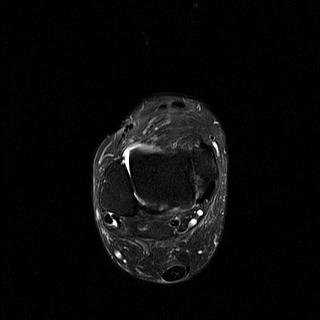
[im 31/36]
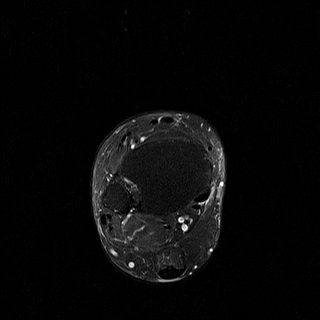
[im 36/36]
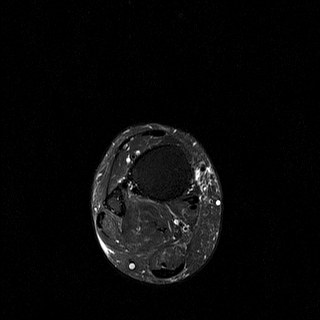

[Series 5: T2 fat-sat · coronal · 3.0mm · 0.56mm/px · 9 of 44 slices shown (2 of 3)]
[im 1/44]
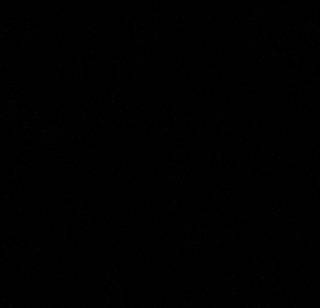
[im 6/44]
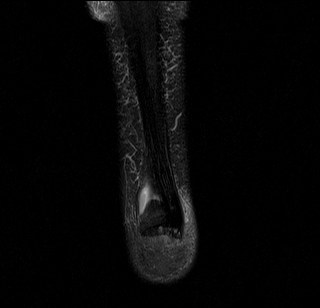
[im 11/44]
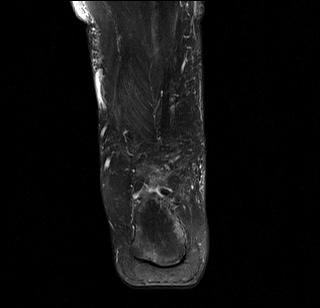
[im 17/44]
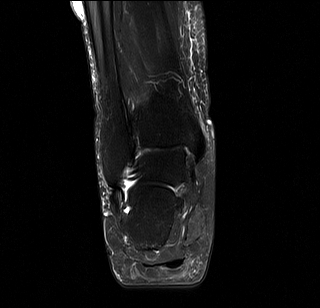
[im 22/44]
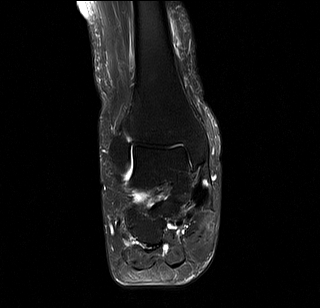
[im 27/44]
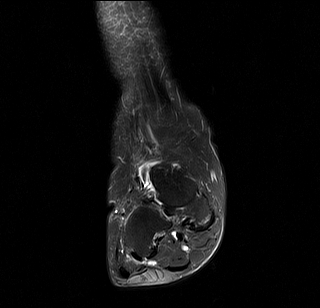
[im 33/44]
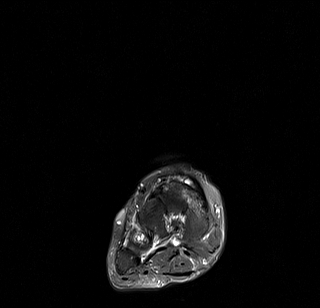
[im 38/44]
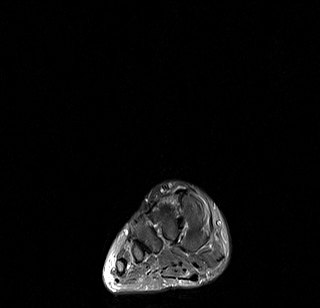
[im 44/44]
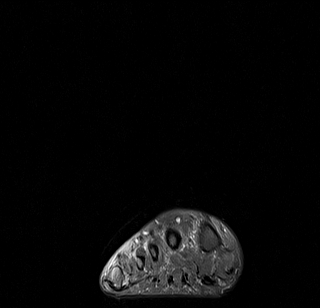

[Series 6: T1 · sagittal · 3.0mm · 0.47mm/px · 5 of 24 slices shown]
[im 1/24]
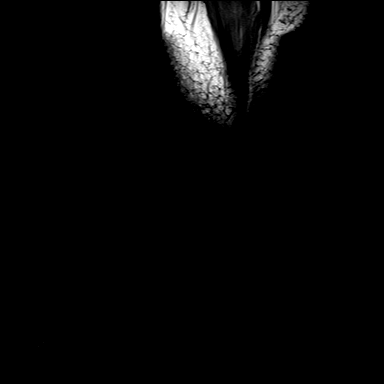
[im 6/24]
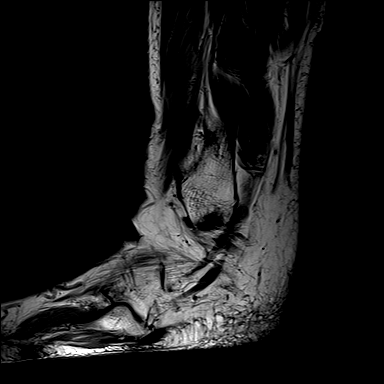
[im 12/24]
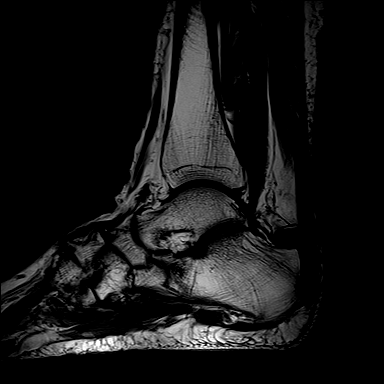
[im 18/24]
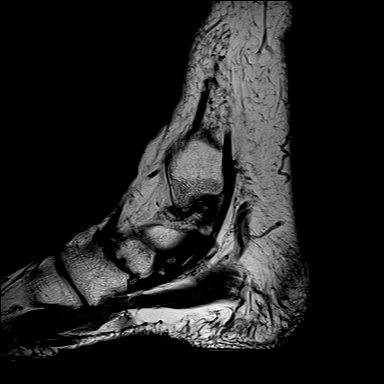
[im 24/24]
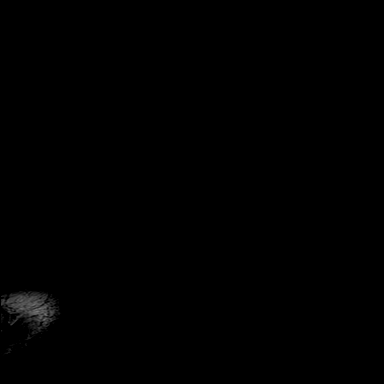

[Series 7: T2 fat-sat · sagittal · 3.0mm · 0.56mm/px · 5 of 24 slices shown (3 of 3)]
[im 1/24]
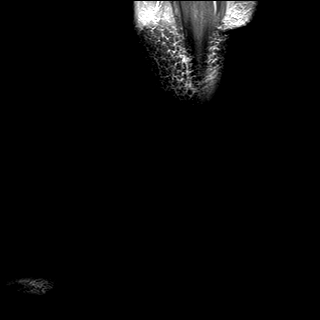
[im 6/24]
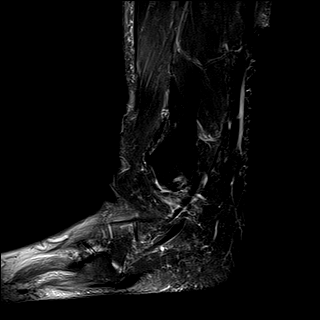
[im 12/24]
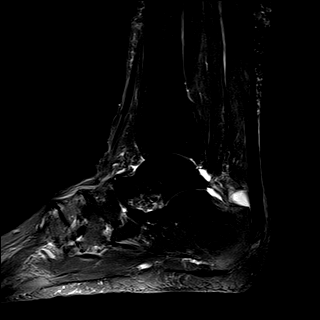
[im 18/24]
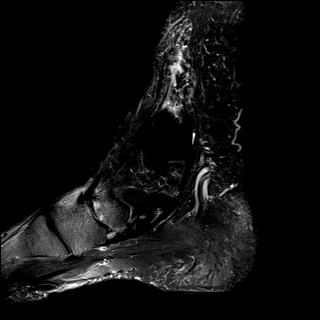
[im 24/24]
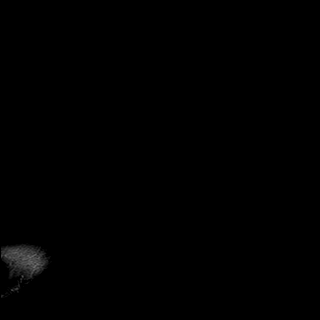

[Series 8: STIR · sagittal · 3.0mm · 0.70mm/px · 5 of 24 slices shown]
[im 1/24]
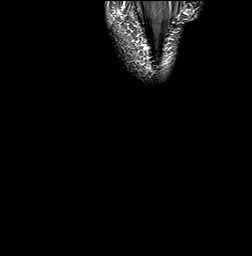
[im 6/24]
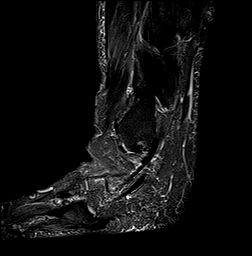
[im 12/24]
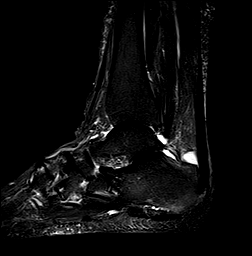
[im 18/24]
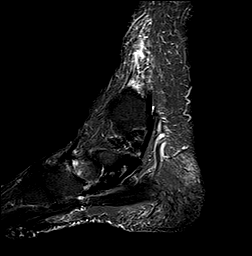
[im 24/24]
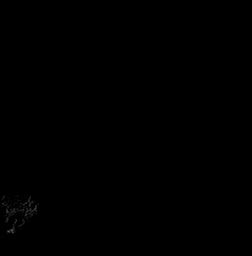

[40 of 40 positions shown; findings below may reference images not displayed]

FINDINGS: TENDONS

Peroneal: Mild common peroneus tendon sheath tenosynovitis in the
vicinity of the lateral malleolus.

Posteromedial: Tibialis posterior distal tendinopathy and mild
tenosynovitis, images 16 through 21 series 4. Multipartite accessory
navicular with faint internal edema signal in the vicinity of the
distal tibialis posterior tendon.

Anterior: Unremarkable

Achilles: Moderate distal Achilles tendinopathy with fusiform
anterior convexity in the distal tendon along with mild abnormally
increased internal signal in the distal tendon tracking
longitudinally. Low-level edema within the Achilles spurring of the
calcaneus. Mild pre Achilles bursitis, image [DATE].

Plantar Fascia: Abnormal thickening of the medial band of the
plantar fascia with adjacent edema there is some focal abnormal
thickening of the lateral band of the plantar fascia adjacent to the
base of the fifth metatarsal.

LIGAMENTS

Lateral: Unremarkable

Medial: Unremarkable

CARTILAGE

Ankle Joint: Unremarkable

Subtalar Joints/Sinus Tarsi: Unremarkable

Bones: Considerable degenerative arthropathy dorsally in the midfoot
and at the Lisfranc joint. Thickened Lisfranc ligament, no
malalignment at the Lisfranc joint. Mild loss of the normal
longitudinal arch of the foot. The degenerative arthropathy in the
midfoot and Lisfranc joint consists of spurring and degenerative
subcortical marrow edema.
IMPRESSION: 1. Moderate distal Achilles tendinopathy with pre Achilles bursitis.
2. Plantar fasciitis.
3. Tibialis posterior distal tendinopathy and tenosynovitis, with
low-level edema and several small accessory navicular bones,
correlate clinically in assessing for tibialis posterior
dysfunction.
4. Mild loss of the longitudinal arch of the foot suggesting mild
pes planus. Considerable degenerative arthropathy in the midfoot and
Lisfranc joint.

## 2016-12-15 ENCOUNTER — Ambulatory Visit
Admission: RE | Admit: 2016-12-15 | Discharge: 2016-12-15 | Disposition: A | Discharge status: Home / Self Care | Payer: Medicare Other | Source: Ambulatory Visit | Attending: Internal Medicine | Admitting: Internal Medicine

## 2016-12-15 DIAGNOSIS — Z1231 Encounter for screening mammogram for malignant neoplasm of breast: Secondary | ICD-10-CM

## 2016-12-15 HISTORY — DX: Malignant (primary) neoplasm, unspecified: C80.1

## 2017-01-28 ENCOUNTER — Emergency Department: Payer: Medicare Other

## 2017-01-28 ENCOUNTER — Emergency Department
Admission: EM | Admit: 2017-01-28 | Discharge: 2017-01-28 | Disposition: A | Discharge status: Home / Self Care | Payer: Medicare Other | Attending: Emergency Medicine | Admitting: Emergency Medicine

## 2017-01-28 DIAGNOSIS — Z85828 Personal history of other malignant neoplasm of skin: Secondary | ICD-10-CM | POA: Insufficient documentation

## 2017-01-28 DIAGNOSIS — Z87891 Personal history of nicotine dependence: Secondary | ICD-10-CM | POA: Insufficient documentation

## 2017-01-28 DIAGNOSIS — R0789 Other chest pain: Secondary | ICD-10-CM | POA: Insufficient documentation

## 2017-01-28 DIAGNOSIS — E876 Hypokalemia: Secondary | ICD-10-CM | POA: Diagnosis not present

## 2017-01-28 DIAGNOSIS — R197 Diarrhea, unspecified: Secondary | ICD-10-CM | POA: Insufficient documentation

## 2017-01-28 DIAGNOSIS — R002 Palpitations: Secondary | ICD-10-CM

## 2017-01-28 DIAGNOSIS — Z7982 Long term (current) use of aspirin: Secondary | ICD-10-CM | POA: Insufficient documentation

## 2017-01-28 DIAGNOSIS — I10 Essential (primary) hypertension: Secondary | ICD-10-CM | POA: Diagnosis not present

## 2017-01-28 DIAGNOSIS — Z79899 Other long term (current) drug therapy: Secondary | ICD-10-CM | POA: Insufficient documentation

## 2017-01-28 DIAGNOSIS — R112 Nausea with vomiting, unspecified: Secondary | ICD-10-CM | POA: Diagnosis not present

## 2017-01-28 LAB — CBC
HEMATOCRIT: 40.5 % (ref 35.0–47.0)
Hemoglobin: 14.1 g/dL (ref 12.0–16.0)
MCH: 28.7 pg (ref 26.0–34.0)
MCHC: 34.7 g/dL (ref 32.0–36.0)
MCV: 82.6 fL (ref 80.0–100.0)
Platelets: 185 10*3/uL (ref 150–440)
RBC: 4.9 MIL/uL (ref 3.80–5.20)
RDW: 14.3 % (ref 11.5–14.5)
WBC: 8.5 10*3/uL (ref 3.6–11.0)

## 2017-01-28 LAB — BASIC METABOLIC PANEL
Anion gap: 14 (ref 5–15)
BUN: 18 mg/dL (ref 6–20)
CHLORIDE: 95 mmol/L — AB (ref 101–111)
CO2: 24 mmol/L (ref 22–32)
Calcium: 9.2 mg/dL (ref 8.9–10.3)
Creatinine, Ser: 0.69 mg/dL (ref 0.44–1.00)
GFR calc non Af Amer: >60 mL/min (ref >60)
GLUCOSE: 123 mg/dL — AB (ref 65–99)
POTASSIUM: 3 mmol/L — AB (ref 3.5–5.1)
Sodium: 133 mmol/L — ABNORMAL LOW (ref 135–145)

## 2017-01-28 LAB — TROPONIN I: Troponin I: <0.03 ng/mL (ref <0.03)

## 2017-01-28 MED ORDER — POTASSIUM CHLORIDE 10 MEQ/100ML IV SOLN
10.0000 meq | Freq: Once | INTRAVENOUS | Status: AC
  Administered 2017-01-28: 10 meq via INTRAVENOUS
  Filled 2017-01-28: qty 100

## 2017-01-28 MED ORDER — ONDANSETRON HCL 4 MG/2ML IJ SOLN
4.0000 mg | Freq: Once | INTRAMUSCULAR | Status: AC
  Administered 2017-01-28: 4 mg via INTRAVENOUS
  Filled 2017-01-28: qty 2

## 2017-01-28 MED ORDER — SODIUM CHLORIDE 0.9 % IV BOLUS (SEPSIS)
1000.0000 mL | Freq: Once | INTRAVENOUS | Status: AC
  Administered 2017-01-28: 1000 mL via INTRAVENOUS

## 2017-01-28 NOTE — ED Provider Notes (Signed)
[facility] Emergency Department Provider Note ____________________________________________   First MD Initiated Contact with Patient 01/28/17 [phone]     (approximate)  I have reviewed the triage vital signs and the nursing notes.   HISTORY  Chief Complaint Chest Pain    HPI Kerry Jefferson is a 71 y.o. female Who presents with palpitations for several hours, described as feeling like her heart is beating fast, associated with mild chest tightness, and with feeling like she is dehydrated. Patient states that she was painting outside yesterday for several hours and felt tired, and then afterwards during the night had nausea vomiting and several episodes of diarrhea. The vomiting and diarrhea have resolved but she still feels somewhat nauseous. Patient states that she normally takes metoprolol that helps with the palpitations but she was unable to take it this morning because of her nausea and vomiting.  She denies difficulty breathing, exertional symptoms, lightheadedness, or fever.  Past Medical History:  Diagnosis Date  . Cancer ([place])    skin  . GERD (gastroesophageal reflux disease)   . Hyperlipidemia   . Hypertension   . Tendinitis     Patient Active Problem List   Diagnosis Date Noted  . Allergic rhinitis 08/20/2015  . Colon polyp 08/20/2015  . Cyst of right ovary 08/20/2015  . Endometriosis 08/20/2015  . Acid reflux 08/20/2015  . History of colitis 08/20/2015  . H/O viral illness 08/20/2015  . Osteopenia 08/20/2015  . 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 HLD (hyperlipidemia) 07/21/2015  . APC (atrial premature contractions) 07/21/2014  . Essential (primary) hypertension 02/10/2014  . Facial numbness 12/30/2013  . Jerking 12/30/2013  . Arthritis, degenerative 11/17/2013  . Benign essential HTN 11/10/2013  . Cephalalgia 11/10/2013  . Pure hypercholesterolemia 11/10/2013  . Feeling bilious 07/26/2011    Past Surgical History:    Procedure Laterality Date  . ABDOMINAL HYSTERECTOMY    . BREAST BIOPSY Left 2003   negative  . CHOLECYSTECTOMY    . COLONOSCOPY WITH PROPOFOL N/A 10/22/2015   Procedure: COLONOSCOPY WITH PROPOFOL;  Surgeon: Manya Silvas, MD;  Location: [facility] ENDOSCOPY;  Service: Endoscopy;  Laterality: N/A;  . TONSILLECTOMY      Prior to Admission medications   Medication Sig Start Date End Date Taking? Authorizing Provider  aspirin EC 81 MG tablet Take 1 tablet by mouth daily.   Yes [provider]  Cholecalciferol (VITAMIN D-1000 MAX ST) 1000 UNITS tablet Take 1 tablet by mouth daily.   Yes [provider]  cloNIDine (CATAPRES) 0.1 MG tablet Take 1 tablet (0.1 mg total) by mouth 2 (two) times daily as needed. 04/06/15 01/28/17 Yes Daymon Larsen, MD  Coenzyme Q10 (COQ-10 PO) Take 1 tablet by mouth daily.   Yes [provider]  losartan-hydrochlorothiazide (HYZAAR) 100-12.5 MG per tablet Take 1 tablet by mouth daily.  04/07/13  Yes [provider]  metoprolol tartrate (LOPRESSOR) 100 MG tablet Take 25 mg by mouth 2 (two) times daily.  11/20/16  Yes [provider]  simvastatin (ZOCOR) 80 MG tablet Take 80 mg by mouth daily at 6 PM.  04/07/13  Yes [provider]  meloxicam (MOBIC) 15 MG tablet Take 1 tablet (15 mg total) by mouth daily. Patient not taking: Reported on 01/28/2017 09/21/15   Landis Martins, DPM  metaxalone (SKELAXIN) 800 MG tablet Take 1 tablet by mouth 3 (three) times daily as needed. Pt has not started this medication yet. 03/30/15   [provider]  Omega-3 1000 MG  CAPS Take 1 capsule by mouth daily. 05/24/11   [provider]  sucralfate (CARAFATE) 1 g tablet Take 1 tablet (1 g total) by mouth 4 (four) times daily. Patient not taking: Reported on 01/28/2017 09/02/15   Nance Pear, MD  TOPROL XL 100 MG 24 hr tablet Take 100 mg by mouth daily.  03/04/13   [provider]    Allergies Atenolol; Ciprofloxacin;  Ciprofloxacin hcl; Lisinopril; Moxifloxacin; Sertraline; Sulfa antibiotics; and Penicillins  Family History  Problem Relation Age of Onset  . Breast cancer Maternal Grandmother 80    Social History Social History  Substance Use Topics  . Smoking status: Former Research scientist (life sciences)  . Smokeless tobacco: Never Used     Comment: quit 10 years ago  . Alcohol use No    Review of Systems  Constitutional: No fever/chills Eyes: No redness. ENT: No sore throat. Cardiovascular: positive for palpitations. Respiratory: Denies shortness of breath. Gastrointestinal: Positive for nausea, vomiting, and diarrhea.  Genitourinary: Negative for dysuria.  Musculoskeletal: Negative for back pain. Skin: Negative for rash. Neurological: Negative for headaches, focal weakness or numbness.   ____________________________________________   PHYSICAL EXAM:  VITAL SIGNS: ED Triage Vitals  Enc Vitals Group     BP 01/28/17 0849 (!) 146/67     Pulse Rate 01/28/17 0849 97     Resp 01/28/17 0849 14     Temp 01/28/17 0849 98.8 F (37.1 C)     Temp Source 01/28/17 0849 Oral     SpO2 01/28/17 0849 95 %     Weight 01/28/17 0850 185 lb (83.9 kg)     Height 01/28/17 0850 5' 5"  (1.651 m)     Head Circumference --      Peak Flow --      Pain Score --      Pain Loc --      Pain Edu? --      Excl. in Hurt? --     Constitutional: Alert and oriented. Well appearing and in no acute distress. Eyes: Conjunctivae are normal.  Head: Atraumatic. Nose: No congestion/rhinnorhea. Mouth/Throat: Mucous membranes are dry.  Neck: Normal range of motion.  Cardiovascular: Normal rate, regular rhythm. Grossly normal heart sounds.  Good peripheral circulation. Respiratory: Normal respiratory effort.  No retractions. Lungs CTAB. Gastrointestinal: Soft and nontender. No distention.  Genitourinary: No CVA tenderness. Musculoskeletal: No lower extremity edema.  Extremities warm and well perfused.  Neurologic:  Normal speech and  language. No gross focal neurologic deficits are appreciated.  Skin:  Skin is warm and dry. No rash noted. Psychiatric: Mood and affect are normal. Speech and behavior are normal.  ____________________________________________   LABS (all labs ordered are listed, but only abnormal results are displayed)  Labs Reviewed  BASIC METABOLIC PANEL - Abnormal; Notable for the following:       Result Value   Sodium 133 (*)    Potassium 3.0 (*)    Chloride 95 (*)    Glucose, Bld 123 (*)    All other components within normal limits  CBC  TROPONIN I   ____________________________________________  EKG  ED ECG REPORT I, Arta Silence, the attending physician, personally viewed and interpreted this ECG.  Date: 01/28/2017 EKG Time: 8:42 Rate: 93 Rhythm: normal sinus rhythm QRS Axis: normal Intervals: normal ST/T Wave abnormalities: LVH with repolarization abnormality, flattened ST segments in lead I and II, small T-wave inversions V4 through V6. Narrative Interpretation: nonspecific new T wave inversions in lateral leads, otherwise unchanged from EKG of 09/03/15  ED ECG REPORT I, Arta Silence, the attending physician, personally viewed and interpreted this ECG.  Date: 01/28/2017 EKG Time: 11:22 Rate: 78 Rhythm: normal sinus rhythm QRS Axis: normal Intervals: normal ST/T Wave abnormalities: LVH with repolarization abnormality, flattened ST segments in lead I and II, small T-wave inversions vs flattening V4 through V6. Narrative Interpretation: no evidence of acute ischemia, no changes from prior EKG today  ____________________________________________  RADIOLOGY  Chest X ray with no significant findings.  ____________________________________________   PROCEDURES  Procedure(s) performed: No    Critical Care performed: No ____________________________________________   INITIAL IMPRESSION / ASSESSMENT AND PLAN / ED COURSE  Pertinent labs & imaging results that  were available during my care of the patient were reviewed by me and considered in my medical decision making (see chart for details).  71 year old female past medical history as noted presents with palpitations and chest tightness as well as feeling weak since this morning, after a bout of nausea, vomiting, and diarrhea last night. Patient states she came to the ED mainly because she feels dehydrated. On exam, vital signs are normal except for slight hypertension, and exam is otherwise unremarkable. Abdomen soft and nontender. Patient is well-appearing. EKG overall morphology is identical to prior except for possible new T-wave inversions versus flattening in the lateral leads. Overall very low suspicion for ACS given no chest pain, and acute onset of vomiting and diarrhea.  Presentation most consistent with gastroenteritis versus foodborne illness. Suspect patient is mildly dehydrated, and also did not take her medications leading to her current symptoms. Plan: Basic labs, cardiac enzymes 1 given duration of sx >6h, IV fluids, and will repeat EKG to rule out any dynamic changes.    ----------------------------------------- 11:46 AM on 01/28/2017 -----------------------------------------  She states that she is feeling much better and tolerating by mouth. She would like to go home. Her workup is negative except for borderline low potassium (she was 3.3 one year ago so her baseline may be slightly low), and slightly low chloride, both of which are most likely due to GI losses. Potassium repleted here and patient will be instructed to follow-up with her primary care doctor. EKG with no dynamic changes. Patient remains stable. Return precautions given.  ____________________________________________   FINAL CLINICAL IMPRESSION(S) / ED DIAGNOSES  Final diagnoses:  Non-intractable vomiting with nausea, unspecified vomiting type  Palpitations  Hypokalemia      NEW MEDICATIONS STARTED DURING THIS  VISIT:  Discharge Medication List as of 01/28/2017 11:56 AM       Note:  This document was prepared using Dragon voice recognition software and may include unintentional dictation errors.    Arta Silence, MD 01/28/17 [phone]

## 2017-01-28 NOTE — ED Triage Notes (Signed)
Pt presents via POV c/o N/V/D since last PM and heart palpitations starting today. Reports hx palpitations and takes BB for it. Denies chest pain.

## 2017-01-28 NOTE — Discharge Instructions (Signed)
Your potassium was slightly low today but we have given you extra potassium in the ER.  Follow up with your regular doctor within the next week, and you should have it rechecked.  Take your blood pressure and other medicines today as usual.  Return to the ER for new or worsening vomiting, weakness, chest pain, difficulty breathing, palpitations, fever or any other new or worsening symptoms that concern you.

## 2017-11-09 ENCOUNTER — Other Ambulatory Visit: Payer: Self-pay | Admitting: Internal Medicine

## 2017-11-09 DIAGNOSIS — Z1231 Encounter for screening mammogram for malignant neoplasm of breast: Secondary | ICD-10-CM

## 2017-11-28 ENCOUNTER — Ambulatory Visit
Admission: RE | Admit: 2017-11-28 | Discharge: 2017-11-28 | Disposition: A | Discharge status: Home / Self Care | Payer: Medicare Other | Source: Ambulatory Visit | Attending: Internal Medicine | Admitting: Internal Medicine

## 2017-11-28 DIAGNOSIS — Z1231 Encounter for screening mammogram for malignant neoplasm of breast: Secondary | ICD-10-CM | POA: Insufficient documentation

## 2017-12-25 DIAGNOSIS — R002 Palpitations: Secondary | ICD-10-CM | POA: Insufficient documentation

## 2018-06-19 ENCOUNTER — Ambulatory Visit (INDEPENDENT_AMBULATORY_CARE_PROVIDER_SITE_OTHER): Payer: Medicare Other | Admitting: Podiatry

## 2018-06-19 ENCOUNTER — Encounter: Payer: Self-pay | Admitting: Podiatry

## 2018-06-19 DIAGNOSIS — L600 Ingrowing nail: Secondary | ICD-10-CM

## 2018-06-19 DIAGNOSIS — Q828 Other specified congenital malformations of skin: Secondary | ICD-10-CM | POA: Diagnosis not present

## 2018-06-19 MED ORDER — NEOMYCIN-POLYMYXIN-HC 1 % OT SOLN: OTIC | 1 refills | Status: DC

## 2018-06-19 NOTE — Progress Notes (Signed)
Subjective:  Patient ID: Kerry Jefferson, female    DOB: November 24, 1945,  MRN: [id number] HPI Chief Complaint  Patient presents with  . Nail Problem    patient presents today with ingrown toenail left 2nd and 4th x couple of years.  She reports she has had both nails removed, but they are growning back sharp and "lumpy"   She reports the 4th toenail is sore on the lateral side and her nail is splitting in half.  she also c/o callous to bottom of left forefoot which causes pressure when she walks.  No treatment done    73 y.o. female presents with the above complaint.   ROS: Denies fever chills nausea vomiting muscle aches pains calf pain back pain chest pain shortness of breath.  Past Medical History:  Diagnosis Date  . Cancer ([place])    skin  . GERD (gastroesophageal reflux disease)   . Hyperlipidemia   . Hypertension   . Tendinitis    Past Surgical History:  Procedure Laterality Date  . ABDOMINAL HYSTERECTOMY    . BREAST BIOPSY Left 2003   negative  . CHOLECYSTECTOMY    . COLONOSCOPY WITH PROPOFOL N/A 10/22/2015   Procedure: COLONOSCOPY WITH PROPOFOL;  Surgeon: Manya Silvas, MD;  Location: [facility] ENDOSCOPY;  Service: Endoscopy;  Laterality: N/A;  . TONSILLECTOMY      Current Outpatient Medications:  .  Calcium-Vitamin D 600-200 MG-UNIT tablet, Take by mouth., Disp: , Rfl:  .  esomeprazole (NEXIUM) 20 MG capsule, Take by mouth., Disp: , Rfl:  .  fluticasone (FLONASE) 50 MCG/ACT nasal spray, Place into the nose., Disp: , Rfl:  .  aspirin EC 81 MG tablet, Take 1 tablet by mouth daily., Disp: , Rfl:  .  Coenzyme Q10 (COQ-10 PO), Take 1 tablet by mouth daily., Disp: , Rfl:  .  fluticasone (FLONASE) 50 MCG/ACT nasal spray, USE TWO SPRAY(S) IN EACH NOSTRIL ONCE DAILY., Disp: , Rfl:  .  folic acid (FOLVITE) 132 MCG tablet, Take by mouth., Disp: , Rfl:  .  losartan-hydrochlorothiazide (HYZAAR) 100-12.5 MG per tablet, Take 1 tablet by mouth daily. , Disp: , Rfl:  .  metoprolol tartrate  (LOPRESSOR) 100 MG tablet, Take 25 mg by mouth 2 (two) times daily. , Disp: , Rfl:  .  NEOMYCIN-POLYMYXIN-HYDROCORTISONE (CORTISPORIN) 1 % SOLN OTIC solution, Apply 1-2 drops to toe bid after soaking, Disp: 10 mL, Rfl: 1 .  Omega-3 1000 MG CAPS, Take 1 capsule by mouth daily., Disp: , Rfl:  .  omeprazole (PRILOSEC) 10 MG capsule, Take by mouth., Disp: , Rfl:  .  simvastatin (ZOCOR) 80 MG tablet, Take 80 mg by mouth daily at 6 PM. , Disp: , Rfl:  .  TOPROL XL 100 MG 24 hr tablet, Take 100 mg by mouth daily. , Disp: , Rfl:  .  traZODone (DESYREL) 50 MG tablet, , Disp: , Rfl:   Allergies  Allergen Reactions  . Atenolol Other (See Comments)    fatigue  . Ciprofloxacin Other (See Comments)    Caused drug induced hepatitis   . Ciprofloxacin Hcl Other (See Comments)    Hepatitis  . Lisinopril Other (See Comments)    Fatigue  . Moxifloxacin Other (See Comments)    Hepatitis  . Sertraline Other (See Comments)  . Sulfa Antibiotics Other (See Comments)    Hallucinations   . Penicillins Itching and Other (See Comments)    pruritus   Review of Systems Objective:  There were no vitals filed for this visit.  General: Well developed, nourished, in no acute distress, alert and oriented x3   Dermatological: Skin is warm, dry and supple bilateral. Nails x 10 are well maintained; remaining integument appears unremarkable at this time. There are no open sores, no preulcerative lesions, no rash or signs of infection present.  Vascular: Dorsalis Pedis artery and Posterior Tibial artery pedal pulses are 2/4 bilateral with immedate capillary fill time. Pedal hair growth present. No varicosities and no lower extremity edema present bilateral.   Neruologic: Grossly intact via light touch bilateral. Vibratory intact via tuning fork bilateral. Protective threshold with Semmes Wienstein monofilament intact to all pedal sites bilateral. Patellar and Achilles deep tendon reflexes 2+ bilateral. No Babinski or  clonus noted bilateral.   Musculoskeletal: No gross boney pedal deformities bilateral. No pain, crepitus, or limitation noted with foot and ankle range of motion bilateral. Muscular strength 5/5 in all groups tested bilateral.  Gait: Unassisted, Nonantalgic.    Radiographs:  None taken  Assessment & Plan:   Assessment: Ingrown toenails with nail dystrophy painful in nature second and fourth toes of the left foot.  Plan: After local anesthesia was administered to the second and fourth toe of the left foot a total nail avulsion was performed with a chemical matrixectomy.  Tolerated procedure well and was provided with both oral and written home-going instruction for the care and soaking of the toes.  I will follow-up with her in 2 weeks for reevaluation.     Mccormick Macon T. [place], [place]

## 2018-06-19 NOTE — Patient Instructions (Signed)

## 2018-06-20 ENCOUNTER — Telehealth: Payer: Self-pay | Admitting: *Deleted

## 2018-06-20 NOTE — Telephone Encounter (Signed)
Pt states had an ingrown toenail procedure 06/19/2018, and the drops are not covered by insurance and cost $75.00. pt asked if there were other ointments she could use.

## 2018-06-20 NOTE — Telephone Encounter (Signed)
I called pt and informed the Dr. Milinda Pointer preferred the drops, they hand an antiinflammatory property that helped decrease inflammation and had less chances of maceration. Pt states she will get the drops and asked if she should use the antibiotic bandaids. I told pt she should use plain bandaid, and to choose a fabric bandaid that allowed for extra moisture to be wicked from the wound.

## 2018-07-03 ENCOUNTER — Ambulatory Visit: Payer: Medicare Other | Admitting: Podiatry

## 2018-07-08 ENCOUNTER — Ambulatory Visit (INDEPENDENT_AMBULATORY_CARE_PROVIDER_SITE_OTHER): Payer: Medicare Other | Admitting: Podiatry

## 2018-07-08 ENCOUNTER — Encounter: Payer: Self-pay | Admitting: Podiatry

## 2018-07-08 DIAGNOSIS — Z9889 Other specified postprocedural states: Secondary | ICD-10-CM

## 2018-07-08 DIAGNOSIS — L600 Ingrowing nail: Secondary | ICD-10-CM

## 2018-07-08 NOTE — Progress Notes (Signed)
She presents today for follow-up of ingrown toenail second and fourth left.  She states they are doing better but they are still tender to touch.  Objective: Vital signs stable she is alert oriented x3 surgical sites appear to be healing very nicely nailbed second and fourth with some dark discoloration does not appear to be necrotic.  Some fibrin deposition is noted on the second but yet granulation tissue is occurring and there is skin overgrowing.  Assessment: Well-healing surgical toes.  Plan: Recommend she continue to soak as she has been that they seem to be healing in very nicely.

## 2018-07-09 ENCOUNTER — Emergency Department
Admission: EM | Admit: 2018-07-09 | Discharge: 2018-07-09 | Disposition: A | Discharge status: Home / Self Care | Payer: Medicare Other | Attending: Emergency Medicine | Admitting: Emergency Medicine

## 2018-07-09 ENCOUNTER — Encounter: Payer: Self-pay | Admitting: Emergency Medicine

## 2018-07-09 ENCOUNTER — Other Ambulatory Visit: Payer: Self-pay

## 2018-07-09 ENCOUNTER — Emergency Department: Payer: Medicare Other

## 2018-07-09 DIAGNOSIS — E871 Hypo-osmolality and hyponatremia: Secondary | ICD-10-CM | POA: Diagnosis not present

## 2018-07-09 DIAGNOSIS — E876 Hypokalemia: Secondary | ICD-10-CM | POA: Insufficient documentation

## 2018-07-09 DIAGNOSIS — Z9049 Acquired absence of other specified parts of digestive tract: Secondary | ICD-10-CM | POA: Insufficient documentation

## 2018-07-09 DIAGNOSIS — Z85828 Personal history of other malignant neoplasm of skin: Secondary | ICD-10-CM | POA: Diagnosis not present

## 2018-07-09 DIAGNOSIS — Z87891 Personal history of nicotine dependence: Secondary | ICD-10-CM | POA: Diagnosis not present

## 2018-07-09 DIAGNOSIS — I10 Essential (primary) hypertension: Secondary | ICD-10-CM | POA: Diagnosis not present

## 2018-07-09 LAB — CBC
HCT: 38.8 % (ref 36.0–46.0)
Hemoglobin: 12.6 g/dL (ref 12.0–15.0)
MCH: 28.7 pg (ref 26.0–34.0)
MCHC: 32.5 g/dL (ref 30.0–36.0)
MCV: 88.4 fL (ref 80.0–100.0)
PLATELETS: 228 10*3/uL (ref 150–400)
RBC: 4.39 MIL/uL (ref 3.87–5.11)
RDW: 13.4 % (ref 11.5–15.5)
WBC: 12.7 10*3/uL — ABNORMAL HIGH (ref 4.0–10.5)
nRBC: 0 % (ref 0.0–0.2)

## 2018-07-09 LAB — BASIC METABOLIC PANEL
Anion gap: 11 (ref 5–15)
BUN: 18 mg/dL (ref 8–23)
CALCIUM: 8.8 mg/dL — AB (ref 8.9–10.3)
CO2: 25 mmol/L (ref 22–32)
Chloride: 97 mmol/L — ABNORMAL LOW (ref 98–111)
Creatinine, Ser: 0.92 mg/dL (ref 0.44–1.00)
GFR calc Af Amer: >60 mL/min (ref >60)
GFR calc non Af Amer: >60 mL/min (ref >60)
GLUCOSE: 103 mg/dL — AB (ref 70–99)
Potassium: 3.4 mmol/L — ABNORMAL LOW (ref 3.5–5.1)
Sodium: 133 mmol/L — ABNORMAL LOW (ref 135–145)

## 2018-07-09 LAB — TROPONIN I: Troponin I: <0.03 ng/mL (ref <0.03)

## 2018-07-09 NOTE — ED Triage Notes (Signed)
Pt arrives with concerns over high blood pressure readings today. Pt 201/67 in triage. Pt also reports chest pressure/discomfort in the middle of her chest that started today. Pt reports HX of HTN and compliance with medication regimen.

## 2018-07-09 NOTE — ED Provider Notes (Signed)
[facility] Emergency Department Provider Note  ____________________________________________  Time seen: Approximately 9:06 PM  I have reviewed the triage vital signs and the nursing notes.   HISTORY  Chief Complaint Chest Pain and Hypertension    HPI Kerry Jefferson is a 73 y.o. female with a history of HTN, HL, on losartan and metoprolol presenting for hypertension.  The patient reports that this afternoon she felt "not quite right," describing a "full" sensation in her head.  She checked her blood pressure and it was elevated.  She then took her losartan and metoprolol and came immediately to the emergency department.  In triage, her blood pressure was 210/67 and on my exam her blood pressure was 167/76, approximately 1 hour after she had taken her blood pressure medicines.  She denied any associated chest pain, shortness of breath, lightheadedness or fainting, visual changes, numbness tingling or weakness.  She did have the sensation of her "heart beating hard."  Patient denies any recent changes in her blood pressure medications, but did start nitrofurantoin for her UTI today.  Cardiologist, who manages her blood pressure medications, as Dr. Saralyn Pilar and reports that she saw him last week without any active problems.  Past Medical History:  Diagnosis Date  . Cancer ([place])    skin  . GERD (gastroesophageal reflux disease)   . Hyperlipidemia   . Hypertension   . Tendinitis     Patient Active Problem List   Diagnosis Date Noted  . Heart palpitations 12/25/2017  . Allergic rhinitis 08/20/2015  . Colon polyp 08/20/2015  . Cyst of right ovary 08/20/2015  . Endometriosis 08/20/2015  . Acid reflux 08/20/2015  . History of colitis 08/20/2015  . H/O viral illness 08/20/2015  . Osteopenia 08/20/2015  . 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 HLD (hyperlipidemia) 07/21/2015  . APC (atrial premature contractions) 07/21/2014  . Essential (primary)  hypertension 02/10/2014  . Facial numbness 12/30/2013  . Jerking 12/30/2013  . Arthritis, degenerative 11/17/2013  . Benign essential HTN 11/10/2013  . Cephalalgia 11/10/2013  . Pure hypercholesterolemia 11/10/2013  . Feeling bilious 07/26/2011    Past Surgical History:  Procedure Laterality Date  . ABDOMINAL HYSTERECTOMY    . BREAST BIOPSY Left 2003   negative  . CHOLECYSTECTOMY    . COLONOSCOPY WITH PROPOFOL N/A 10/22/2015   Procedure: COLONOSCOPY WITH PROPOFOL;  Surgeon: Manya Silvas, MD;  Location: [facility] ENDOSCOPY;  Service: Endoscopy;  Laterality: N/A;  . TONSILLECTOMY      Current Outpatient Rx  . Order #: [id number] Class: Historical Med  . Order #: [id number] Class: Historical Med  . Order #: [id number] Class: Historical Med  . Order #: [id number] Class: Historical Med  . Order #: [id number] Class: Historical Med  . Order #: [id number] Class: Historical Med  . Order #: [id number] Class: Historical Med  . Order #: [id number] Class: Historical Med  . Order #: [id number] Class: Historical Med  . Order #: [id number] Class: Normal  . Order #: [id number] Class: Historical Med  . Order #: [id number] Class: Historical Med  . Order #: [id number] Class: Historical Med  . Order #: [id number] Class: Historical Med  . Order #: [id number] Class: Historical Med    Allergies Atenolol; Ciprofloxacin; Ciprofloxacin hcl; Lisinopril; Moxifloxacin; Sertraline; Sulfa antibiotics; and Penicillins  Family History  Problem Relation Age of Onset  . Breast cancer Maternal Grandmother 80    Social History Social History   Tobacco Use  . Smoking status: Former Research scientist (life sciences)  . Smokeless tobacco: Never Used  . Tobacco comment: quit 10 years ago  Substance Use Topics  .  Alcohol use: No  . Drug use: No    Review of Systems Constitutional: No fever/chills.  No lightheadedness or syncope.  No diaphoresis. Eyes: No visual changes. ENT: No sore throat. No congestion or rhinorrhea. Cardiovascular: Denies chest pain.  Denies palpitations.  Heart beating "hard."  Hypertension at home, now improving after antihypertensive use. Respiratory: Denies shortness of breath.  No cough. Gastrointestinal: No abdominal pain.  No nausea, no vomiting.  No diarrhea.  No constipation. Genitourinary: Taking nitrofurantoin for UTI. Musculoskeletal: Negative for back pain. Skin: Negative for rash. Neurological: Negative for headaches. No focal numbness, tingling or weakness.  No changes in vision, speech or mental status.    ____________________________________________   PHYSICAL EXAM:  VITAL SIGNS: ED Triage Vitals [07/09/18 1805]  Enc Vitals Group     BP (!) 210/67     Pulse Rate 68     Resp 17     Temp 97.9 F (36.6 C)     Temp Source Oral     SpO2 98 %     Weight 185 lb (83.9 kg)     Height 5' 5"  (1.651 m)     Head Circumference      Peak Flow      Pain Score 7     Pain Loc      Pain Edu?      Excl. in [place]?     Constitutional: Alert and oriented.Answers questions appropriately. Eyes: Conjunctivae are normal.  EOMI. No scleral icterus. Head: Atraumatic. Nose: No congestion/rhinnorhea. Mouth/Throat: Mucous membranes are moist.  Neck: No stridor.  Supple.  No JVD.  No meningismus. Cardiovascular: Normal rate, regular rhythm. No murmurs, rubs or gallops.  Respiratory: Normal respiratory effort.  No accessory muscle use or retractions. Lungs CTAB.  No wheezes, rales or ronchi. Gastrointestinal: Overweight.  Soft, nontender and nondistended.  No guarding or rebound.  No peritoneal signs. Musculoskeletal: No LE edema. No ttp in the calves or palpable cords.  Negative Homan's sign. Neurologic:  A&Ox3.  Speech is clear.  Face and smile are symmetric.  EOMI. PERRLA.  Moves all extremities well.  Normal gait without ataxia. Skin:  Skin is warm, dry and intact. No rash noted. Psychiatric: Mood and affect are normal. Speech and behavior are normal.  Normal  judgement.  ____________________________________________   LABS (all labs ordered are listed, but only abnormal results are displayed)  Labs Reviewed  BASIC METABOLIC PANEL - Abnormal; Notable for the following components:      Result Value   Sodium 133 (*)    Potassium 3.4 (*)    Chloride 97 (*)    Glucose, Bld 103 (*)    Calcium 8.8 (*)    All other components within normal limits  CBC - Abnormal; Notable for the following components:   WBC 12.7 (*)    All other components within normal limits  TROPONIN I   ____________________________________________  EKG  ED ECG REPORT I, Anne-Caroline Mariea Clonts, the attending physician, personally viewed and interpreted this ECG.   Date: 07/09/2018  EKG Time: 1804  Rate: 66  Rhythm: normal sinus rhythm  Axis: leftward  Intervals:none  ST&T Change: No STEMI  ____________________________________________  RADIOLOGY  Dg Chest 2 View  Result Date: 07/09/2018 CLINICAL DATA:  Chest discomfort EXAM: CHEST - 2 VIEW COMPARISON:  01/28/2017 FINDINGS: Mild bronchitic changes. Streaky atelectasis at the bases. No focal consolidation or effusion. Stable cardiomediastinal silhouette. No pneumothorax. IMPRESSION: No active cardiopulmonary disease. Bronchitic changes and mild scarring or atelectasis at the bases.  Electronically Signed   By: Donavan Foil M.D.   On: 07/09/2018 18:56    ____________________________________________   PROCEDURES  Procedure(s) performed: None  Procedures  Critical Care performed: No ____________________________________________   INITIAL IMPRESSION / ASSESSMENT AND PLAN / ED COURSE  Pertinent labs & imaging results that were available during my care of the patient were reviewed by me and considered in my medical decision making (see chart for details).  73 y.o. female with a history of hypertension presenting with a full feeling in her head, with hypertension.  Overall, the patient's blood pressure is now  improving now that she has taken her home blood pressure medications.  She is asymptomatic and has no neurologic or cardiopulmonary findings on my examination.  Her laboratory studies are reassuring with no renal insufficiency.  She has chronic and unchanged mild hyponatremia and hypokalemia.  Her white blood cell count is 12.7, but this is likely attributable to her UTI, for which she initiated treatment today.  Her troponin was negative and her chest x-ray was reassuring.  At this time, the patient is safe for discharge home.  She and I had a long discussion about doing twice daily blood pressure checks and bringing the record with her to her cardiologist's office.  She understands that she may have worsening essential hypertension, or that the timing of her blood pressure medications results in some mid afternoon hypertension due to wearing off of the effects of her medications.  At this time, I see no acute medical emergency, including ACS or MI, stroke.  Follow-up instructions as well as return precautions have been discussed.  ____________________________________________  FINAL CLINICAL IMPRESSION(S) / ED DIAGNOSES  Final diagnoses:  Hypertension, unspecified type  Hypokalemia  Hyponatremia         NEW MEDICATIONS STARTED DURING THIS VISIT:  New Prescriptions   No medications on file      Eula Listen, MD 07/09/18 2114

## 2018-07-09 NOTE — Discharge Instructions (Addendum)
Please measure your blood pressure every day in the morning, and in the mid afternoon.  Please bring the record of your blood pressure with you to your primary care physician's office and to your cardiologist for ongoing medication recommendations.  Turn to the emergency department if you develop severe pain, lightheadedness or fainting, numbness tingling or weakness, chest pain, shortness of breath, or any other symptoms concerning to you.

## 2018-11-06 ENCOUNTER — Other Ambulatory Visit: Payer: Self-pay | Admitting: Internal Medicine

## 2018-11-06 DIAGNOSIS — Z1231 Encounter for screening mammogram for malignant neoplasm of breast: Secondary | ICD-10-CM

## 2018-12-17 ENCOUNTER — Ambulatory Visit
Admission: RE | Admit: 2018-12-17 | Discharge: 2018-12-17 | Disposition: A | Discharge status: Home / Self Care | Payer: Medicare Other | Source: Ambulatory Visit | Attending: Internal Medicine | Admitting: Internal Medicine

## 2018-12-17 DIAGNOSIS — Z1231 Encounter for screening mammogram for malignant neoplasm of breast: Secondary | ICD-10-CM | POA: Diagnosis present

## 2019-04-03 ENCOUNTER — Other Ambulatory Visit: Payer: Self-pay

## 2019-04-03 ENCOUNTER — Other Ambulatory Visit: Payer: Self-pay | Admitting: Gastroenterology

## 2019-04-03 ENCOUNTER — Ambulatory Visit
Admission: RE | Admit: 2019-04-03 | Discharge: 2019-04-03 | Disposition: A | Discharge status: Home / Self Care | Payer: Medicare Other | Source: Ambulatory Visit | Attending: Gastroenterology | Admitting: Gastroenterology

## 2019-04-03 ENCOUNTER — Other Ambulatory Visit (HOSPITAL_COMMUNITY): Payer: Self-pay | Admitting: Gastroenterology

## 2019-04-03 DIAGNOSIS — R1032 Left lower quadrant pain: Secondary | ICD-10-CM

## 2019-04-03 DIAGNOSIS — D72829 Elevated white blood cell count, unspecified: Secondary | ICD-10-CM

## 2019-04-03 DIAGNOSIS — R102 Pelvic and perineal pain: Secondary | ICD-10-CM

## 2019-04-03 DIAGNOSIS — R197 Diarrhea, unspecified: Secondary | ICD-10-CM | POA: Insufficient documentation

## 2019-04-03 MED ORDER — IOHEXOL 300 MG/ML  SOLN
100.0000 mL | Freq: Once | INTRAMUSCULAR | Status: AC | PRN
  Administered 2019-04-03: 100 mL via INTRAVENOUS

## 2019-05-27 ENCOUNTER — Other Ambulatory Visit
Admission: RE | Admit: 2019-05-27 | Discharge: 2019-05-27 | Disposition: A | Discharge status: Home / Self Care | Payer: Medicare Other | Source: Ambulatory Visit | Attending: Student | Admitting: Student

## 2019-05-27 DIAGNOSIS — Z8619 Personal history of other infectious and parasitic diseases: Secondary | ICD-10-CM | POA: Insufficient documentation

## 2019-05-27 LAB — C DIFFICILE QUICK SCREEN W PCR REFLEX
C Diff antigen: NEGATIVE
C Diff toxin: NEGATIVE

## 2019-05-27 LAB — C DIFFICILE QUICK SCREEN W PCR REFLEX??: C Diff interpretation: NOT DETECTED

## 2019-05-28 DIAGNOSIS — R079 Chest pain, unspecified: Secondary | ICD-10-CM | POA: Insufficient documentation

## 2019-06-02 DIAGNOSIS — K5792 Diverticulitis of intestine, part unspecified, without perforation or abscess without bleeding: Secondary | ICD-10-CM | POA: Insufficient documentation

## 2019-07-14 ENCOUNTER — Ambulatory Visit: Payer: Medicare Other | Attending: Orthopedic Surgery | Admitting: Occupational Therapy

## 2019-07-14 ENCOUNTER — Encounter: Payer: Self-pay | Admitting: Occupational Therapy

## 2019-07-14 ENCOUNTER — Other Ambulatory Visit: Payer: Self-pay

## 2019-07-14 DIAGNOSIS — M79642 Pain in left hand: Secondary | ICD-10-CM | POA: Insufficient documentation

## 2019-07-14 DIAGNOSIS — M25642 Stiffness of left hand, not elsewhere classified: Secondary | ICD-10-CM | POA: Insufficient documentation

## 2019-07-14 DIAGNOSIS — R600 Localized edema: Secondary | ICD-10-CM | POA: Insufficient documentation

## 2019-07-14 DIAGNOSIS — M65322 Trigger finger, left index finger: Secondary | ICD-10-CM | POA: Insufficient documentation

## 2019-07-14 NOTE — Therapy (Signed)
[facility] [address] [place], [address] Phone: [phone]   Fax:  [phone]  Occupational Therapy Evaluation  Patient Details  Name: Kerry Jefferson MRN: [id number] Date of Birth: 06-Aug-1945 Referring Provider (OT): Rudene Christians   Encounter Date: 07/14/2019  OT End of Session - 07/14/19 3419    Visit Number  1    Number of Visits  8    Date for OT Re-Evaluation  08/11/19    OT Start Time  1430    OT Stop Time  1528    OT Time Calculation (min)  58 min    Activity Tolerance  Patient tolerated treatment well    Behavior During Therapy  [facility] for tasks assessed/performed       Past Medical History:  Diagnosis Date  . Cancer ([place])    skin  . GERD (gastroesophageal reflux disease)   . Hyperlipidemia   . Hypertension   . Tendinitis     Past Surgical History:  Procedure Laterality Date  . ABDOMINAL HYSTERECTOMY    . BREAST BIOPSY Left 2003   negative  . CHOLECYSTECTOMY    . COLONOSCOPY WITH PROPOFOL N/A 10/22/2015   Procedure: COLONOSCOPY WITH PROPOFOL;  Surgeon: Manya Silvas, MD;  Location: [facility] ENDOSCOPY;  Service: Endoscopy;  Laterality: N/A;  . TONSILLECTOMY      There were no vitals filed for this visit.  Subjective Assessment - 07/14/19 1627    Subjective   My hand has been bothering me since since last year - trigger finger at my index , then got shot - but did not help - did had prednisone last year - helped for few wks but come back the swelling over my knuckels and the trigger finger    Pertinent History  Pt had hand stiffness and pain last year - was on prednisone and then on Oct 2020 had shot for trigger finger 2nd digit- cont to have stiffness in the morning , pain and trigger finger at 2nd - swelling over MC's  R hand worse than L - xray showed last yearshows significant intercarpal degenerative change, moderate arthritis of the IP joints, and mild to moderate arthritis of the MCP joints  of all fingers.    Patient Stated Goals  Want to know what cause my hand pain , swelling and stiffness - and get the index finger better and to motion in R hand to grip, hold , squeeze objects    Currently in Pain?  Yes    Pain Score  3     Pain Location  Hand    Pain Orientation  Right    Pain Descriptors / Indicators  Aching;Tightness    Pain Type  Chronic pain    Pain Onset  More than a month ago    Pain Frequency  Intermittent    Aggravating Factors   using them , gripping        [facility] OT Assessment - 07/14/19 0001      Assessment   Medical Diagnosis  R 2nd digit trigger finger , pain , stifffness     Referring Provider (OT)  Rudene Christians    Onset Date/Surgical Date  02/20/19    Hand Dominance  Right      Home  Environment   Lives With  Spouse      Prior Function   Vocation  Retired    Leisure  likes to Scientist, research (life sciences), Training and development officer, yard work ,  Strength   Right Hand Grip (lbs)  50    Right Hand Lateral Pinch  12 lbs    Right Hand 3 Point Pinch  12 lbs    Left Hand Grip (lbs)  40    Left Hand Lateral Pinch  14 lbs    Left Hand 3 Point Pinch  11 lbs      Right Hand AROM   R Index  MCP 0-90  90 Degrees    R Index PIP 0-100  95 Degrees    R Long  MCP 0-90  90 Degrees    R Long PIP 0-100  100 Degrees    R Ring  MCP 0-90  90 Degrees    R Ring PIP 0-100  100 Degrees    R Little  MCP 0-90  90 Degrees    R Little PIP 0-100  100 Degrees        contrast done -and review HEP with pt and hand out provided :     Contrast  3 x day  Tendon glides - but composite fist to 2 fingers out of palm - no triggering  Increase to one finger and then palm - during week -if trigger better  wear isotoner glove at night time  Keep pain under 2/10   Joint protection   use larger joints, pain control And avoid tight and sustained grip              OT Education - 07/14/19 1639    Education Details  findings of eval and HEP    Person(s) Educated  Patient    Methods   Explanation;Demonstration;Tactile cues;Verbal cues;Handout    Comprehension  Verbal cues required;Returned demonstration;Verbalized understanding          OT Long Term Goals - 07/14/19 1645      OT LONG TERM GOAL #1   Title  Pt to be independent in HEP to decrease edema, triggering and increase AROM in 2nd digit    Baseline  triggering several times during day , tenderness and 3/10 pain , decrease AROM in 2nd digit    Time  3    Period  Weeks    Status  New    Target Date  08/04/19      OT LONG TERM GOAL #2   Title  Pt report lno triggering of R 2nd digit for week - decrease pain and swelling    Baseline  pain 3/10 , swelling and several times triggering in clinic with flexion of 2nd digit    Time  4    Period  Weeks    Status  New    Target Date  08/11/19      OT LONG TERM GOAL #3   Title  Pt to verbalize 3 joint protection principles and AE to decrease pain and triggering in R hand to use in ADL's    Baseline  no knowledge - keep 2nd digit extended and not using at times-    Time  4    Period  Weeks    Status  New    Target Date  08/11/19            Plan - 07/14/19 1642    Clinical Impression Statement  Pt present at OT eval with diagnsosis of R 2nd digit trigger finger, tender over A1pulley and triggering few times during session - decrease AROM in 2nd of R hand , tender and increase edema - decrease lat grip -pt ed on  joint protection, HEP - to decrease edema , triggering , pain - and icnrease ROM and strength- pt report also numbness at times in 4thand 5th - discuss with pt positions to avoid - did not had tenderness and positive Tinel at [employer] tunnel    OT Occupational Profile and History  Problem Focused Assessment - Including review of records relating to presenting problem    Occupational performance deficits (Please refer to evaluation for details):  ADL's;IADL's;Play;Leisure    Body Structure / Function / Physical Skills  ADL;Flexibility;ROM;UE functional  use;IADL;Pain;Strength;Edema    Rehab Potential  Fair    Clinical Decision Making  Limited treatment options, no task modification necessary    Comorbidities Affecting Occupational Performance:  May have comorbidities impacting occupational performance   ? inflammatory arthritis , gout , OA   Modification or Assistance to Complete Evaluation   No modification of tasks or assist necessary to complete eval    OT Frequency  2x / week    OT Duration  --   3-4 wks   OT Treatment/Interventions  Self-care/ADL training;Therapeutic exercise;Iontophoresis;Patient/family education;Splinting;Contrast Bath;Ultrasound;Manual Therapy    Plan  assess progress with HEP    OT Home Exercise Plan  see pt instruction    Consulted and Agree with Plan of Care  Patient       Patient will benefit from skilled therapeutic intervention in order to improve the following deficits and impairments:   Body Structure / Function / Physical Skills: ADL, Flexibility, ROM, UE functional use, IADL, Pain, Strength, Edema       Visit Diagnosis: Trigger index finger of left hand - Plan: Ot plan of care cert/re-cert  Stiffness of left hand, not elsewhere classified - Plan: Ot plan of care cert/re-cert  Pain in left hand - Plan: Ot plan of care cert/re-cert  Localized edema - Plan: Ot plan of care cert/re-cert    Problem List Patient Active Problem List   Diagnosis Date Noted  . Heart palpitations 12/25/2017  . Allergic rhinitis 08/20/2015  . Colon polyp 08/20/2015  . Cyst of right ovary 08/20/2015  . Endometriosis 08/20/2015  . Acid reflux 08/20/2015  . History of colitis 08/20/2015  . H/O viral illness 08/20/2015  . Osteopenia 08/20/2015  . 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 HLD (hyperlipidemia) 07/21/2015  . APC (atrial premature contractions) 07/21/2014  . Essential (primary) hypertension 02/10/2014  . Facial numbness 12/30/2013  . Jerking 12/30/2013  . Arthritis, degenerative 11/17/2013  .  Benign essential HTN 11/10/2013  . Cephalalgia 11/10/2013  . Pure hypercholesterolemia 11/10/2013  . Feeling bilious 07/26/2011    Rosalyn Gess OTR/L,CLT 07/14/2019, 4:52 PM  [facility] [address] [place], [address] Phone: [phone]   Fax:  [phone]  Name: Kerry Jefferson MRN: [id number] Date of Birth: [birth year]

## 2019-07-14 NOTE — Patient Instructions (Signed)
Contrast  3 x day  Tendon glides - but composite fist to 2 fingers out of palm - no triggering  Increase to one finger and then palm - during week -if trigger better  wear isotoner glove at night time  Keep pain under 2/10   Joint protection   use larger joints, pain control And avoid tight and sustained grip

## 2019-07-21 ENCOUNTER — Ambulatory Visit: Payer: Medicare Other | Attending: Orthopedic Surgery | Admitting: Occupational Therapy

## 2019-07-21 ENCOUNTER — Other Ambulatory Visit: Payer: Self-pay

## 2019-07-21 DIAGNOSIS — M79642 Pain in left hand: Secondary | ICD-10-CM | POA: Diagnosis present

## 2019-07-21 DIAGNOSIS — R600 Localized edema: Secondary | ICD-10-CM | POA: Diagnosis present

## 2019-07-21 DIAGNOSIS — M65322 Trigger finger, left index finger: Secondary | ICD-10-CM

## 2019-07-21 DIAGNOSIS — M25642 Stiffness of left hand, not elsewhere classified: Secondary | ICD-10-CM | POA: Diagnosis present

## 2019-07-21 NOTE — Therapy (Signed)
[facility] [address] [place], [address] Phone: [phone]   Fax:  [phone]  Occupational Therapy Treatment  Patient Details  Name: Kerry Jefferson MRN: [id number] Date of Birth: Aug 18, 1945 Referring Provider (OT): Rudene Christians   Encounter Date: 07/21/2019  OT End of Session - 07/21/19 1253    Visit Number  2    Number of Visits  8    Date for OT Re-Evaluation  08/11/19    OT Start Time  1130    OT Stop Time  1217    OT Time Calculation (min)  47 min    Activity Tolerance  Patient tolerated treatment well    Behavior During Therapy  [facility] for tasks assessed/performed       Past Medical History:  Diagnosis Date  . Cancer ([place])    skin  . GERD (gastroesophageal reflux disease)   . Hyperlipidemia   . Hypertension   . Tendinitis     Past Surgical History:  Procedure Laterality Date  . ABDOMINAL HYSTERECTOMY    . BREAST BIOPSY Left 2003   negative  . CHOLECYSTECTOMY    . COLONOSCOPY WITH PROPOFOL N/A 10/22/2015   Procedure: COLONOSCOPY WITH PROPOFOL;  Surgeon: Manya Silvas, MD;  Location: [facility] ENDOSCOPY;  Service: Endoscopy;  Laterality: N/A;  . TONSILLECTOMY      There were no vitals filed for this visit.  Subjective Assessment - 07/21/19 1250    Subjective   I am waiting to hear back about appt with rheumathologist- still triggering when I grip objects ,or squeezes - and I sleep with my hands and elbow in fetal position    Pertinent History  Pt had hand stiffness and pain last year - was on prednisone and then on Oct 2020 had shot for trigger finger 2nd digit- cont to have stiffness in the morning , pain and trigger finger at 2nd - swelling over MC's  R hand worse than L - xray showed last yearshows significant intercarpal degenerative change, moderate arthritis of the IP joints, and mild to moderate arthritis of the MCP joints of all fingers.    Patient Stated Goals  Want to know what cause my  hand pain , swelling and stiffness - and get the index finger better and to motion in R hand to grip, hold , squeeze objects    Currently in Pain?  Yes    Pain Score  6     Pain Location  Hand    Pain Orientation  Right    Pain Descriptors / Indicators  Tightness;Tender    Pain Type  Acute pain;Chronic pain    Pain Onset  More than a month ago    Pain Frequency  Intermittent       Pt cont to have tenderness over A1pulley of 2nd digit - and triggering with gripping and squeezing act  stiff in the am - pt report she sleeps in fetal position with hands  AROM for MC flexion 90 and PIP intrinsic fist 100   but pain and triggering with composite - PIP flexion then 80             OT Treatments/Exercises (OP) - 07/21/19 0001      Iontophoresis   Type of Iontophoresis  Dexamethasone    Location  A1pulley of R 2nd digit    Dose  small patch, 2.0 current    Time  19      RUE Contrast Bath  Time  9 minutes    Comments  prior to ROM and soft tissue       fabricate for pt MC block splint for 2nd to wear night time and during day with composite fist or squeezing activities  Skin check done prior to ionto and pt ed on what to expect - and skin check done afterwards - not issues        OT Education - 07/21/19 1253    Education Details  HEP and splint wearing - ionto use    Person(s) Educated  Patient    Methods  Explanation;Demonstration;Tactile cues;Verbal cues;Handout    Comprehension  Verbal cues required;Returned demonstration;Verbalized understanding          OT Long Term Goals - 07/14/19 1645      OT LONG TERM GOAL #1   Title  Pt to be independent in HEP to decrease edema, triggering and increase AROM in 2nd digit    Baseline  triggering several times during day , tenderness and 3/10 pain , decrease AROM in 2nd digit    Time  3    Period  Weeks    Status  New    Target Date  08/04/19      OT LONG TERM GOAL #2   Title  Pt report lno triggering of R 2nd digit  for week - decrease pain and swelling    Baseline  pain 3/10 , swelling and several times triggering in clinic with flexion of 2nd digit    Time  4    Period  Weeks    Status  New    Target Date  08/11/19      OT LONG TERM GOAL #3   Title  Pt to verbalize 3 joint protection principles and AE to decrease pain and triggering in R hand to use in ADL's    Baseline  no knowledge - keep 2nd digit extended and not using at times-    Time  4    Period  Weeks    Status  New    Target Date  08/11/19            Plan - 07/21/19 1256    Clinical Impression Statement  Pt cont to show triggering per pt - several times during day -and tenderness over A1pulley of 2nd - pt great AROM in 2nd digit with MC flexion , and intirinsic fist - but composite fist - trigger and pain - MC block splint for pt to wear night time and with gripping activities during day -and initiate this date ionto with dexamethazone - pt tolerate well    OT Occupational Profile and History  Problem Focused Assessment - Including review of records relating to presenting problem    Occupational performance deficits (Please refer to evaluation for details):  ADL's;IADL's;Play;Leisure    Body Structure / Function / Physical Skills  ADL;Flexibility;ROM;UE functional use;IADL;Pain;Strength;Edema    Rehab Potential  Fair    Clinical Decision Making  Limited treatment options, no task modification necessary    Comorbidities Affecting Occupational Performance:  May have comorbidities impacting occupational performance    Modification or Assistance to Complete Evaluation   No modification of tasks or assist necessary to complete eval    OT Frequency  2x / week    OT Duration  4 weeks    OT Treatment/Interventions  Self-care/ADL training;Therapeutic exercise;Iontophoresis;Patient/family education;Splinting;Contrast Bath;Ultrasound;Manual Therapy    Plan  assess progress with HEP    OT Home Exercise Plan  see  pt instruction    Consulted  and Agree with Plan of Care  Patient       Patient will benefit from skilled therapeutic intervention in order to improve the following deficits and impairments:   Body Structure / Function / Physical Skills: ADL, Flexibility, ROM, UE functional use, IADL, Pain, Strength, Edema       Visit Diagnosis: Trigger index finger of left hand  Stiffness of left hand, not elsewhere classified  Pain in left hand  Localized edema    Problem List Patient Active Problem List   Diagnosis Date Noted  . Heart palpitations 12/25/2017  . Allergic rhinitis 08/20/2015  . Colon polyp 08/20/2015  . Cyst of right ovary 08/20/2015  . Endometriosis 08/20/2015  . Acid reflux 08/20/2015  . History of colitis 08/20/2015  . H/O viral illness 08/20/2015  . Osteopenia 08/20/2015  . 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 HLD (hyperlipidemia) 07/21/2015  . APC (atrial premature contractions) 07/21/2014  . Essential (primary) hypertension 02/10/2014  . Facial numbness 12/30/2013  . Jerking 12/30/2013  . Arthritis, degenerative 11/17/2013  . Benign essential HTN 11/10/2013  . Cephalalgia 11/10/2013  . Pure hypercholesterolemia 11/10/2013  . Feeling bilious 07/26/2011    Rosalyn Gess OTR/l,CLT 07/21/2019, 12:59 PM  [place] [address] [place], [address] Phone: [phone]   Fax:  [phone]  Name: Kerry Jefferson MRN: [id number] Date of Birth: 05-19-1946

## 2019-07-21 NOTE — Patient Instructions (Signed)
Same HEP -but MC block ring splint on for night time and when making composite fist to prevent triggering

## 2019-07-24 ENCOUNTER — Ambulatory Visit: Payer: Medicare Other | Admitting: Occupational Therapy

## 2019-07-24 ENCOUNTER — Other Ambulatory Visit: Payer: Self-pay

## 2019-07-24 DIAGNOSIS — M65322 Trigger finger, left index finger: Secondary | ICD-10-CM

## 2019-07-24 DIAGNOSIS — M79642 Pain in left hand: Secondary | ICD-10-CM

## 2019-07-24 DIAGNOSIS — M25642 Stiffness of left hand, not elsewhere classified: Secondary | ICD-10-CM

## 2019-07-24 DIAGNOSIS — R600 Localized edema: Secondary | ICD-10-CM

## 2019-07-24 NOTE — Therapy (Signed)
[facility] [facility] [address], [place], [address] Phone: [phone]   Fax:  [phone]  Occupational Therapy Treatment  Patient Details  Name: Kerry Jefferson MRN: [id number] Date of Birth: 25-Jan-1946 Referring Provider (OT): Rudene Christians   Encounter Date: 07/24/2019  OT End of Session - 07/24/19 1005    Visit Number  3    Number of Visits  8    Date for OT Re-Evaluation  08/11/19    OT Start Time  0941    OT Stop Time  1046    OT Time Calculation (min)  65 min    Activity Tolerance  Patient tolerated treatment well    Behavior During Therapy  [facility] for tasks assessed/performed       Past Medical History:  Diagnosis Date  . Cancer ([place])    skin  . GERD (gastroesophageal reflux disease)   . Hyperlipidemia   . Hypertension   . Tendinitis     Past Surgical History:  Procedure Laterality Date  . ABDOMINAL HYSTERECTOMY    . BREAST BIOPSY Left 2003   negative  . CHOLECYSTECTOMY    . COLONOSCOPY WITH PROPOFOL N/A 10/22/2015   Procedure: COLONOSCOPY WITH PROPOFOL;  Surgeon: Manya Silvas, MD;  Location: [facility] ENDOSCOPY;  Service: Endoscopy;  Laterality: N/A;  . TONSILLECTOMY      There were no vitals filed for this visit.  Subjective Assessment - 07/24/19 1003    Subjective   Tues appt iwht Rheumathologist - that index finger still stiff - mostly in the am - and mostly stiffness that pain - but the ring finger too    Pertinent History  Pt had hand stiffness and pain last year - was on prednisone and then on Oct 2020 had shot for trigger finger 2nd digit- cont to have stiffness in the morning , pain and trigger finger at 2nd - swelling over MC's  R hand worse than L - xray showed last yearshows significant intercarpal degenerative change, moderate arthritis of the IP joints, and mild to moderate arthritis of the MCP joints of all fingers.    Patient Stated Goals  Want to know what cause my hand pain , swelling and  stiffness - and get the index finger better and to motion in R hand to grip, hold , squeeze objects    Currently in Pain?  No/denies         [facility] OT Assessment - 07/24/19 0001      Right Hand AROM   R Index  MCP 0-90  90 Degrees    R Index PIP 0-100  90 Degrees        Pt cont to have tenderness over A1pulley of 2nd digit - and triggering with gripping and squeezing act end range or tight grip  But less triggering in session after tendon glides    stiff in the am more than pain - Notice pt not using 2nd digit - keep it extended -and explain that it cause increase stiffness pt report she sleeps in fetal position with hands 0 did make MC block splint for her to sleep at night with  AROM for MC flexion 90 and PIP intrinsic fist 90        skin check done prior and pt to keep patch on for hour afterwards   OT Treatments/Exercises (OP) - 07/24/19 0001      Iontophoresis   Type of Iontophoresis  Dexamethasone    Location  A1pulley  of R 2nd digit    Dose  small patch, 2.0 current    Time  19      RUE Contrast Bath   Time  9 minutes    Comments  prior to soft tissue and ROM      Done some brushing of Graston tool nr 2 on volar 2nd digits and palm  Prior to tendon glides  Increase AROM at [facility] and PIP - afterwards  reinforce pt to use contrast prior to tendon glides  And MC block splint as needed during day and night time        OT Education - 07/24/19 1005    Person(s) Educated  Patient    Methods  Explanation;Demonstration;Tactile cues;Verbal cues;Handout    Comprehension  Verbal cues required;Returned demonstration;Verbalized understanding          OT Long Term Goals - 07/14/19 1645      OT LONG TERM GOAL #1   Title  Pt to be independent in HEP to decrease edema, triggering and increase AROM in 2nd digit    Baseline  triggering several times during day , tenderness and 3/10 pain , decrease AROM in 2nd digit    Time  3    Period  Weeks    Status  New     Target Date  08/04/19      OT LONG TERM GOAL #2   Title  Pt report lno triggering of R 2nd digit for week - decrease pain and swelling    Baseline  pain 3/10 , swelling and several times triggering in clinic with flexion of 2nd digit    Time  4    Period  Weeks    Status  New    Target Date  08/11/19      OT LONG TERM GOAL #3   Title  Pt to verbalize 3 joint protection principles and AE to decrease pain and triggering in R hand to use in ADL's    Baseline  no knowledge - keep 2nd digit extended and not using at times-    Time  4    Period  Weeks    Status  New    Target Date  08/11/19            Plan - 07/24/19 1006    Clinical Impression Statement  Pt cont to be tender over R 2nd A1pulley- with stiffness more than pain in 2nd digit- less triggering notice -but pt report if she do make tight fist - it do trigger - pt wearing MC block splint at night time and does cause some stiffness - pt to do her contrast and AROM tendon glides - did get appt with Rheumathologist next week -cont to do HEP and modify grip to decrease triggeirng    OT Occupational Profile and History  Problem Focused Assessment - Including review of records relating to presenting problem    Occupational performance deficits (Please refer to evaluation for details):  ADL's;IADL's;Play;Leisure    Body Structure / Function / Physical Skills  ADL;Flexibility;ROM;UE functional use;IADL;Pain;Strength;Edema    Rehab Potential  Fair    Clinical Decision Making  Limited treatment options, no task modification necessary    Comorbidities Affecting Occupational Performance:  May have comorbidities impacting occupational performance    Modification or Assistance to Complete Evaluation   No modification of tasks or assist necessary to complete eval    OT Frequency  2x / week    OT Duration  --  3wks   OT Treatment/Interventions  Self-care/ADL training;Therapeutic exercise;Iontophoresis;Patient/family  education;Splinting;Contrast Bath;Ultrasound;Manual Therapy    Plan  assess progress with HEP    OT Home Exercise Plan  see pt instruction    Consulted and Agree with Plan of Care  Patient       Patient will benefit from skilled therapeutic intervention in order to improve the following deficits and impairments:   Body Structure / Function / Physical Skills: ADL, Flexibility, ROM, UE functional use, IADL, Pain, Strength, Edema       Visit Diagnosis: Trigger index finger of left hand  Stiffness of left hand, not elsewhere classified  Pain in left hand  Localized edema    Problem List Patient Active Problem List   Diagnosis Date Noted  . Heart palpitations 12/25/2017  . Allergic rhinitis 08/20/2015  . Colon polyp 08/20/2015  . Cyst of right ovary 08/20/2015  . Endometriosis 08/20/2015  . Acid reflux 08/20/2015  . History of colitis 08/20/2015  . H/O viral illness 08/20/2015  . Osteopenia 08/20/2015  . 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 HLD (hyperlipidemia) 07/21/2015  . APC (atrial premature contractions) 07/21/2014  . Essential (primary) hypertension 02/10/2014  . Facial numbness 12/30/2013  . Jerking 12/30/2013  . Arthritis, degenerative 11/17/2013  . Benign essential HTN 11/10/2013  . Cephalalgia 11/10/2013  . Pure hypercholesterolemia 11/10/2013  . Feeling bilious 07/26/2011    Rosalyn Gess OTR/L,CLT 07/24/2019, 12:04 PM  [place] [facility] [address], [place], [address] Phone: [phone]   Fax:  [phone]  Name: MARRANDA ARAKELIAN MRN: [id number] Date of Birth: 1946/04/23

## 2019-07-29 DIAGNOSIS — M65321 Trigger finger, right index finger: Secondary | ICD-10-CM | POA: Insufficient documentation

## 2019-07-29 DIAGNOSIS — M159 Polyosteoarthritis, unspecified: Secondary | ICD-10-CM | POA: Insufficient documentation

## 2019-07-29 DIAGNOSIS — E790 Hyperuricemia without signs of inflammatory arthritis and tophaceous disease: Secondary | ICD-10-CM | POA: Insufficient documentation

## 2019-07-31 ENCOUNTER — Ambulatory Visit: Payer: Medicare Other | Admitting: Occupational Therapy

## 2019-08-31 ENCOUNTER — Emergency Department: Payer: Medicare Other

## 2019-08-31 ENCOUNTER — Encounter: Payer: Self-pay | Admitting: Emergency Medicine

## 2019-08-31 ENCOUNTER — Inpatient Hospital Stay
Admission: EM | Admit: 2019-08-31 | Discharge: 2019-09-04 | DRG: 387 | Disposition: A | Discharge status: Home / Self Care | Payer: Medicare Other | Attending: Hospitalist | Admitting: Hospitalist

## 2019-08-31 ENCOUNTER — Other Ambulatory Visit: Payer: Self-pay

## 2019-08-31 DIAGNOSIS — N179 Acute kidney failure, unspecified: Secondary | ICD-10-CM

## 2019-08-31 DIAGNOSIS — Z85828 Personal history of other malignant neoplasm of skin: Secondary | ICD-10-CM | POA: Diagnosis not present

## 2019-08-31 DIAGNOSIS — E785 Hyperlipidemia, unspecified: Secondary | ICD-10-CM | POA: Diagnosis present

## 2019-08-31 DIAGNOSIS — Z86010 Personal history of colonic polyps: Secondary | ICD-10-CM | POA: Diagnosis not present

## 2019-08-31 DIAGNOSIS — Z79899 Other long term (current) drug therapy: Secondary | ICD-10-CM

## 2019-08-31 DIAGNOSIS — H9311 Tinnitus, right ear: Secondary | ICD-10-CM | POA: Diagnosis not present

## 2019-08-31 DIAGNOSIS — K921 Melena: Secondary | ICD-10-CM | POA: Diagnosis not present

## 2019-08-31 DIAGNOSIS — E663 Overweight: Secondary | ICD-10-CM | POA: Diagnosis present

## 2019-08-31 DIAGNOSIS — K5150 Left sided colitis without complications: Principal | ICD-10-CM | POA: Diagnosis present

## 2019-08-31 DIAGNOSIS — Z20822 Contact with and (suspected) exposure to covid-19: Secondary | ICD-10-CM | POA: Diagnosis present

## 2019-08-31 DIAGNOSIS — Z6831 Body mass index (BMI) 31.0-31.9, adult: Secondary | ICD-10-CM | POA: Diagnosis not present

## 2019-08-31 DIAGNOSIS — Z9049 Acquired absence of other specified parts of digestive tract: Secondary | ICD-10-CM | POA: Diagnosis not present

## 2019-08-31 DIAGNOSIS — N83201 Unspecified ovarian cyst, right side: Secondary | ICD-10-CM | POA: Diagnosis present

## 2019-08-31 DIAGNOSIS — Z881 Allergy status to other antibiotic agents status: Secondary | ICD-10-CM

## 2019-08-31 DIAGNOSIS — T465X5A Adverse effect of other antihypertensive drugs, initial encounter: Secondary | ICD-10-CM | POA: Diagnosis not present

## 2019-08-31 DIAGNOSIS — Z8719 Personal history of other diseases of the digestive system: Secondary | ICD-10-CM

## 2019-08-31 DIAGNOSIS — K5730 Diverticulosis of large intestine without perforation or abscess without bleeding: Secondary | ICD-10-CM | POA: Diagnosis present

## 2019-08-31 DIAGNOSIS — Z888 Allergy status to other drugs, medicaments and biological substances status: Secondary | ICD-10-CM | POA: Diagnosis not present

## 2019-08-31 DIAGNOSIS — K529 Noninfective gastroenteritis and colitis, unspecified: Secondary | ICD-10-CM | POA: Diagnosis present

## 2019-08-31 DIAGNOSIS — K6289 Other specified diseases of anus and rectum: Secondary | ICD-10-CM | POA: Diagnosis not present

## 2019-08-31 DIAGNOSIS — K219 Gastro-esophageal reflux disease without esophagitis: Secondary | ICD-10-CM | POA: Diagnosis present

## 2019-08-31 DIAGNOSIS — Z87891 Personal history of nicotine dependence: Secondary | ICD-10-CM | POA: Diagnosis not present

## 2019-08-31 DIAGNOSIS — E876 Hypokalemia: Secondary | ICD-10-CM

## 2019-08-31 DIAGNOSIS — R1030 Lower abdominal pain, unspecified: Secondary | ICD-10-CM | POA: Diagnosis not present

## 2019-08-31 DIAGNOSIS — I10 Essential (primary) hypertension: Secondary | ICD-10-CM | POA: Diagnosis present

## 2019-08-31 DIAGNOSIS — Z803 Family history of malignant neoplasm of breast: Secondary | ICD-10-CM

## 2019-08-31 DIAGNOSIS — Z90711 Acquired absence of uterus with remaining cervical stump: Secondary | ICD-10-CM

## 2019-08-31 DIAGNOSIS — E782 Mixed hyperlipidemia: Secondary | ICD-10-CM | POA: Diagnosis present

## 2019-08-31 DIAGNOSIS — R933 Abnormal findings on diagnostic imaging of other parts of digestive tract: Secondary | ICD-10-CM | POA: Diagnosis not present

## 2019-08-31 LAB — COMPREHENSIVE METABOLIC PANEL
ALT: 20 U/L (ref 0–44)
AST: 24 U/L (ref 15–41)
Albumin: 4.2 g/dL (ref 3.5–5.0)
Alkaline Phosphatase: 62 U/L (ref 38–126)
Anion gap: 10 (ref 5–15)
BUN: 24 mg/dL — ABNORMAL HIGH (ref 8–23)
CO2: 27 mmol/L (ref 22–32)
Calcium: 9 mg/dL (ref 8.9–10.3)
Chloride: 101 mmol/L (ref 98–111)
Creatinine, Ser: 1.06 mg/dL — ABNORMAL HIGH (ref 0.44–1.00)
GFR calc Af Amer: 59 mL/min — ABNORMAL LOW (ref >60)
GFR calc non Af Amer: 51 mL/min — ABNORMAL LOW (ref >60)
Glucose, Bld: 118 mg/dL — ABNORMAL HIGH (ref 70–99)
Potassium: 3.4 mmol/L — ABNORMAL LOW (ref 3.5–5.1)
Sodium: 138 mmol/L (ref 135–145)
Total Bilirubin: 0.7 mg/dL (ref 0.3–1.2)
Total Protein: 7 g/dL (ref 6.5–8.1)

## 2019-08-31 LAB — CBC
HCT: 42 % (ref 36.0–46.0)
Hemoglobin: 13.9 g/dL (ref 12.0–15.0)
MCH: 29 pg (ref 26.0–34.0)
MCHC: 33.1 g/dL (ref 30.0–36.0)
MCV: 87.7 fL (ref 80.0–100.0)
Platelets: 193 10*3/uL (ref 150–400)
RBC: 4.79 MIL/uL (ref 3.87–5.11)
RDW: 13.9 % (ref 11.5–15.5)
WBC: 20 10*3/uL — ABNORMAL HIGH (ref 4.0–10.5)
nRBC: 0 % (ref 0.0–0.2)

## 2019-08-31 LAB — URINALYSIS, COMPLETE (UACMP) WITH MICROSCOPIC
Bacteria, UA: NONE SEEN
Bilirubin Urine: NEGATIVE
Glucose, UA: NEGATIVE mg/dL
Hgb urine dipstick: NEGATIVE
Ketones, ur: 5 mg/dL — AB
Leukocytes,Ua: NEGATIVE
Nitrite: NEGATIVE
Protein, ur: NEGATIVE mg/dL
Specific Gravity, Urine: 1.021 (ref 1.005–1.030)
Squamous Epithelial / HPF: NONE SEEN (ref 0–5)
pH: 5 (ref 5.0–8.0)

## 2019-08-31 LAB — LIPASE, BLOOD: Lipase: 45 U/L (ref 11–51)

## 2019-08-31 LAB — LACTIC ACID, PLASMA: Lactic Acid, Venous: 1.2 mmol/L (ref 0.5–1.9)

## 2019-08-31 MED ORDER — SODIUM CHLORIDE 0.9 % IV BOLUS
1000.0000 mL | Freq: Once | INTRAVENOUS | Status: AC
  Administered 2019-09-01: 1000 mL via INTRAVENOUS

## 2019-08-31 MED ORDER — ENOXAPARIN SODIUM 40 MG/0.4ML ~~LOC~~ SOLN
40.0000 mg | SUBCUTANEOUS | Status: DC
  Administered 2019-09-01 – 2019-09-04 (×4): 40 mg via SUBCUTANEOUS
  Filled 2019-08-31 (×4): qty 0.4

## 2019-08-31 MED ORDER — IOHEXOL 300 MG/ML  SOLN
100.0000 mL | Freq: Once | INTRAMUSCULAR | Status: AC | PRN
  Administered 2019-08-31: 100 mL via INTRAVENOUS

## 2019-08-31 MED ORDER — ONDANSETRON HCL 4 MG/2ML IJ SOLN
4.0000 mg | Freq: Once | INTRAMUSCULAR | Status: AC
  Administered 2019-08-31: 4 mg via INTRAVENOUS
  Filled 2019-08-31: qty 2

## 2019-08-31 MED ORDER — HYDROMORPHONE HCL 1 MG/ML IJ SOLN
0.5000 mg | Freq: Once | INTRAMUSCULAR | Status: AC
  Administered 2019-08-31: 0.5 mg via INTRAVENOUS
  Filled 2019-08-31: qty 1

## 2019-08-31 MED ORDER — SODIUM CHLORIDE 0.9% FLUSH: 3.0000 mL | Freq: Once | INTRAVENOUS | Status: DC

## 2019-08-31 NOTE — ED Triage Notes (Signed)
PT to ER states has hx of diverticulitis.  States she just finished abx 2 days ago and now having cramping pain, bleeding, vomiting and feels weak.  Pt states all above started today.

## 2019-08-31 NOTE — ED Provider Notes (Signed)
[facility] Emergency Department Provider Note  ____________________________________________   First MD Initiated Contact with Patient 08/31/19 2142     (approximate)  I have reviewed the triage vital signs and the nursing notes.   HISTORY  Chief Complaint Abdominal Pain    HPI Kerry Jefferson is a 74 y.o. female with history of diverticulitis who comes in with concerns for diverticulitis.  Patient been having left lower quadrant pain.  Was on a course of Augmentin initially was feeling better but that the pain restarted today, severe, constant, left lower quadrant, nothing makes it better, nothing makes it worse.  Has had some associated nausea and vomiting and sweats with it.  Denies any urinary symptoms, chest pain, shortness of breath.  Patient states that she is been on Augmentin intermittently for the past few months.  She states that she is had for flareups.  Patient does have a history of C. difficile back in December.  Patient also does report a little bit of bright red blood per rectum          Past Medical History:  Diagnosis Date  . Cancer ([place])    skin  . GERD (gastroesophageal reflux disease)   . Hyperlipidemia   . Hypertension   . Tendinitis     Patient Active Problem List   Diagnosis Date Noted  . Heart palpitations 12/25/2017  . Allergic rhinitis 08/20/2015  . Colon polyp 08/20/2015  . Cyst of right ovary 08/20/2015  . Endometriosis 08/20/2015  . Acid reflux 08/20/2015  . History of colitis 08/20/2015  . H/O viral illness 08/20/2015  . Osteopenia 08/20/2015  . 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 HLD (hyperlipidemia) 07/21/2015  . APC (atrial premature contractions) 07/21/2014  . Essential (primary) hypertension 02/10/2014  . Facial numbness 12/30/2013  . Jerking 12/30/2013  . Arthritis, degenerative 11/17/2013  . Benign essential HTN 11/10/2013  . Cephalalgia 11/10/2013  . Pure hypercholesterolemia 11/10/2013  .  Feeling bilious 07/26/2011    Past Surgical History:  Procedure Laterality Date  . ABDOMINAL HYSTERECTOMY    . BREAST BIOPSY Left 2003   negative  . CHOLECYSTECTOMY    . COLONOSCOPY WITH PROPOFOL N/A 10/22/2015   Procedure: COLONOSCOPY WITH PROPOFOL;  Surgeon: Manya Silvas, MD;  Location: [facility] ENDOSCOPY;  Service: Endoscopy;  Laterality: N/A;  . TONSILLECTOMY      Prior to Admission medications   Medication Sig Start Date End Date Taking? Authorizing Provider  aspirin EC 81 MG tablet Take 1 tablet by mouth daily.    [provider]  Calcium-Vitamin D 600-200 MG-UNIT tablet Take by mouth. 08/05/12   [provider]  Coenzyme Q10 (COQ-10 PO) Take 1 tablet by mouth daily.    [provider]  esomeprazole (NEXIUM) 20 MG capsule Take by mouth. 08/05/12   [provider]  fluticasone (FLONASE) 50 MCG/ACT nasal spray Place into the nose. 01/10/18 01/10/19  [provider]  fluticasone (FLONASE) 50 MCG/ACT nasal spray USE TWO SPRAY(S) IN EACH NOSTRIL ONCE DAILY. 06/07/18   [provider]  folic acid (FOLVITE) 678 MCG tablet Take by mouth.    [provider]  losartan-hydrochlorothiazide (HYZAAR) 100-12.5 MG per tablet Take 1 tablet by mouth daily.  04/07/13   [provider]  metoprolol tartrate (LOPRESSOR) 100 MG tablet Take 25 mg by mouth 2 (two) times daily.  11/20/16   [provider]  NEOMYCIN-POLYMYXIN-HYDROCORTISONE (CORTISPORIN) 1 % SOLN OTIC solution Apply 1-2 drops to toe bid after soaking 06/19/18  Hyatt, Max T, DPM  Omega-3 1000 MG CAPS Take 1 capsule by mouth daily. 05/24/11   [provider]  omeprazole (PRILOSEC) 10 MG capsule Take by mouth.    [provider]  simvastatin (ZOCOR) 80 MG tablet Take 80 mg by mouth daily at 6 PM.  04/07/13   [provider]  TOPROL XL 100 MG 24 hr tablet Take 100 mg by mouth daily.  03/04/13   [provider]  traZODone (DESYREL) 50 MG  tablet  04/25/18   [provider]    Allergies Atenolol, Ciprofloxacin, Ciprofloxacin hcl, Lisinopril, Moxifloxacin, Sertraline, Sulfa antibiotics, and Penicillins  Family History  Problem Relation Age of Onset  . Breast cancer Maternal Grandmother 80    Social History Social History   Tobacco Use  . Smoking status: Former Research scientist (life sciences)  . Smokeless tobacco: Never Used  . Tobacco comment: quit 10 years ago  Substance Use Topics  . Alcohol use: No  . Drug use: No      Review of Systems Constitutional: No fever/chills Eyes: No visual changes. ENT: No sore throat. Cardiovascular: Denies chest pain. Respiratory: Denies shortness of breath. Gastrointestinal: Positive abdominal pain, intermittent diarrhea, vomiting Genitourinary: Negative for dysuria. Musculoskeletal: Negative for back pain. Skin: Negative for rash. Neurological: Negative for headaches, focal weakness or numbness. All other ROS negative ____________________________________________   PHYSICAL EXAM:  VITAL SIGNS: ED Triage Vitals [08/31/19 1700]  Enc Vitals Group     BP (!) 151/57     Pulse Rate (!) 59     Resp 18     Temp 97.6 F (36.4 C)     Temp Source Oral     SpO2 99 %     Weight 187 lb (84.8 kg)     Height 5' 5"  (1.651 m)     Head Circumference      Peak Flow      Pain Score 5     Pain Loc      Pain Edu?      Excl. in [place]?     Constitutional: Alert and oriented. Well appearing and in no acute distress. Eyes: Conjunctivae are normal. EOMI. Head: Atraumatic. Nose: No congestion/rhinnorhea. Mouth/Throat: Mucous membranes are moist.   Neck: No stridor. Trachea Midline. FROM Cardiovascular: Normal rate, regular rhythm. Grossly normal heart sounds.  Good peripheral circulation. Respiratory: Normal respiratory effort.  No retractions. Lungs CTAB. Gastrointestinal: Soft with some focal tenderness in the left lower quadrant.  No other tenderness elsewhere.. No distention. No abdominal  bruits.  Musculoskeletal: No lower extremity tenderness nor edema.  No joint effusions. Neurologic:  Normal speech and language. No gross focal neurologic deficits are appreciated.  Skin:  Skin is warm, dry and intact. No rash noted. Psychiatric: Mood and affect are normal. Speech and behavior are normal. GU: Very small amount of bright red blood per rectum  ____________________________________________   LABS (all labs ordered are listed, but only abnormal results are displayed)  Labs Reviewed  COMPREHENSIVE METABOLIC PANEL - Abnormal; Notable for the following components:      Result Value   Potassium 3.4 (*)    Glucose, Bld 118 (*)    BUN 24 (*)    Creatinine, Ser 1.06 (*)    GFR calc non Af Amer 51 (*)    GFR calc Af Amer 59 (*)    All other components within normal limits  CBC - Abnormal; Notable for the following components:   WBC 20.0 (*)    All other components  within normal limits  URINALYSIS, COMPLETE (UACMP) WITH MICROSCOPIC - Abnormal; Notable for the following components:   Color, Urine AMBER (*)    APPearance HAZY (*)    Ketones, ur 5 (*)    All other components within normal limits  CULTURE, BLOOD (ROUTINE X 2)  CULTURE, BLOOD (ROUTINE X 2)  GASTROINTESTINAL PANEL BY PCR, STOOL (REPLACES STOOL CULTURE)  C DIFFICILE QUICK SCREEN W PCR REFLEX  SARS CORONAVIRUS 2 (TAT 6-24 HRS)  LIPASE, BLOOD  LACTIC ACID, PLASMA  LACTIC ACID, PLASMA   ____________________________________________   ED ECG REPORT I, Vanessa Hardy, the attending physician, personally viewed and interpreted this ECG.  EKG is normal sinus rate of 64, no ST ovation, no T wave inversions, normal intervals ____________________________________________  RADIOLOGY   Official radiology report(s): CT ABDOMEN PELVIS W CONTRAST  Result Date: 08/31/2019 CLINICAL DATA:  History of diverticulitis, abdominal cramping with bleeding and vomiting EXAM: CT ABDOMEN AND PELVIS WITH CONTRAST TECHNIQUE:  Multidetector CT imaging of the abdomen and pelvis was performed using the standard protocol following bolus administration of intravenous contrast. CONTRAST:  167m OMNIPAQUE IOHEXOL 300 MG/ML  SOLN COMPARISON:  CT 04/03/2019 FINDINGS: Lower chest: Lung bases demonstrate no acute consolidation or pleural effusion. Heart size upper limits of normal. Hepatobiliary: No focal liver abnormality is seen. Status post cholecystectomy. No biliary dilatation. Pancreas: Unremarkable. No pancreatic ductal dilatation or surrounding inflammatory changes. Spleen: Normal in size without focal abnormality. Adrenals/Urinary Tract: Subcentimeter hypodensity in the lower pole right kidney. Adrenal glands are normal. No hydronephrosis. The bladder is normal Stomach/Bowel: Stomach is nonenlarged. No dilated small bowel. Negative appendix. The previously noted inflammatory changes at the sigmoid colon have improved. Now seen are new inflammatory changes involving the splenic flexure, descending colon and proximal sigmoid colon. No extraluminal gas. Vascular/Lymphatic: Mild aortic atherosclerosis. No aneurysm. No suspicious adenopathy Reproductive: Status post hysterectomy. 3.2 cm right adnexal cyst, unchanged since 04/03/2019. Other: Negative for free air or free fluid. Musculoskeletal: No acute or suspicious osseous abnormality IMPRESSION: 1. Previously noted inflammatory changes at the mid to distal sigmoid colon have improved. There is however interim development of wall thickening and inflammatory change involving the splenic flexure, descending colon and proximal sigmoid colon. Although there are diverticula present at the proximal sigmoid colon, given length of involvement, suspect that the findings are related to colitis of infectious, inflammatory, or ischemic etiology. There is no evidence for a bowel obstruction. Negative for perforation or abscess. 2. 3.2 cm right adnexal cyst. Recommend follow-up pelvic ultrasound. This may  be performed on a nonemergent basis. Electronically Signed   By: KDonavan FoilM.D.   On: 08/31/2019 22:48    ____________________________________________   PROCEDURES  Procedure(s) performed (including Critical Care):  Procedures   ____________________________________________   INITIAL IMPRESSION / ASSESSMENT AND PLAN / ED COURSE  Kerry MARGERUMwas evaluated in Emergency Department on 08/31/2019 for the symptoms described in the history of present illness. She was evaluated in the context of the global COVID-19 pandemic, which necessitated consideration that the patient might be at risk for infection with the SARS-CoV-2 virus that causes COVID-19. Institutional protocols and algorithms that pertain to the evaluation of patients at risk for COVID-19 are in a state of rapid change based on information released by regulatory bodies including the CDC and federal and state organizations. These policies and algorithms were followed during the patient's care in the ED.    Patient is a 74year old who comes in with left lower quadrant pain in  the setting of recurrent diverticulitis.  Will get CT scan to evaluate for perforation, abscess, obstruction.  Will get labs to evaluate for electrolyte abnormalities, AKI  White count is elevated at 20.  Patient given IV pain medicine for her pain.  Patient also given nausea medicine.  We will give a little bit of fluids given her slightly elevated kidney function.  CT scan shows concerns for colitis that could be infectious, inflammatory or ischemic etiology.  Patient does have a history of C. difficile so we will send stool studies to see if it could be C. difficile related.  We consider the possibility ischemic and I got an EKG and there was no evidence of A. fib.  She does have some bright red blood per rectum but she does have diverticulosis unclear was causing the bleeding.  Patient does have a normal lactate.  On repeat abdominal examination her  abdominal pain seems to be much better.  Will discuss with the hospital team for admission to continue doing serial abdominal exams, follow-up on stool studies to decide on antibiotic treatments.         ____________________________________________   FINAL CLINICAL IMPRESSION(S) / ED DIAGNOSES   Final diagnoses:  Colitis      MEDICATIONS GIVEN DURING THIS VISIT:  Medications  sodium chloride flush (NS) 0.9 % injection 3 mL (has no administration in time range)  sodium chloride 0.9 % bolus 1,000 mL (has no administration in time range)  HYDROmorphone (DILAUDID) injection 0.5 mg (0.5 mg Intravenous Given 08/31/19 2207)  ondansetron (ZOFRAN) injection 4 mg (4 mg Intravenous Given 08/31/19 2207)  iohexol (OMNIPAQUE) 300 MG/ML solution 100 mL (100 mLs Intravenous Contrast Given 08/31/19 2226)     ED Discharge Orders    None       Note:  This document was prepared using Dragon voice recognition software and may include unintentional dictation errors.   Vanessa [place], MD 08/31/19 [phone]

## 2019-09-01 ENCOUNTER — Other Ambulatory Visit: Payer: Self-pay

## 2019-09-01 DIAGNOSIS — N179 Acute kidney failure, unspecified: Secondary | ICD-10-CM

## 2019-09-01 DIAGNOSIS — K921 Melena: Secondary | ICD-10-CM

## 2019-09-01 DIAGNOSIS — E876 Hypokalemia: Secondary | ICD-10-CM

## 2019-09-01 LAB — CBC
HCT: 36.7 % (ref 36.0–46.0)
Hemoglobin: 12.3 g/dL (ref 12.0–15.0)
MCH: 29.2 pg (ref 26.0–34.0)
MCHC: 33.5 g/dL (ref 30.0–36.0)
MCV: 87.2 fL (ref 80.0–100.0)
Platelets: 169 10*3/uL (ref 150–400)
RBC: 4.21 MIL/uL (ref 3.87–5.11)
RDW: 14.2 % (ref 11.5–15.5)
WBC: 16.6 10*3/uL — ABNORMAL HIGH (ref 4.0–10.5)
nRBC: 0 % (ref 0.0–0.2)

## 2019-09-01 LAB — GASTROINTESTINAL PANEL BY PCR, STOOL (REPLACES STOOL CULTURE)

## 2019-09-01 LAB — BASIC METABOLIC PANEL
Anion gap: 8 (ref 5–15)
BUN: 22 mg/dL (ref 8–23)
CO2: 27 mmol/L (ref 22–32)
Calcium: 7.7 mg/dL — ABNORMAL LOW (ref 8.9–10.3)
Chloride: 104 mmol/L (ref 98–111)
Creatinine, Ser: 0.84 mg/dL (ref 0.44–1.00)
GFR calc Af Amer: >60 mL/min (ref >60)
GFR calc non Af Amer: >60 mL/min (ref >60)
Glucose, Bld: 116 mg/dL — ABNORMAL HIGH (ref 70–99)
Potassium: 3.6 mmol/L (ref 3.5–5.1)
Sodium: 139 mmol/L (ref 135–145)

## 2019-09-01 LAB — C DIFFICILE QUICK SCREEN W PCR REFLEX
C Diff antigen: NEGATIVE
C Diff interpretation: NOT DETECTED
C Diff toxin: NEGATIVE

## 2019-09-01 LAB — SARS CORONAVIRUS 2 (TAT 6-24 HRS): SARS Coronavirus 2: NEGATIVE

## 2019-09-01 MED ORDER — POTASSIUM CHLORIDE 10 MEQ/100ML IV SOLN
10.0000 meq | Freq: Once | INTRAVENOUS | Status: AC
  Administered 2019-09-01: 10 meq via INTRAVENOUS
  Filled 2019-09-01: qty 100

## 2019-09-01 MED ORDER — FOLIC ACID 1 MG PO TABS
1.0000 mg | ORAL_TABLET | Freq: Every day | ORAL | Status: DC
  Administered 2019-09-01 – 2019-09-04 (×4): 1 mg via ORAL
  Filled 2019-09-01 (×4): qty 1

## 2019-09-01 MED ORDER — ESCITALOPRAM OXALATE 10 MG PO TABS
10.0000 mg | ORAL_TABLET | Freq: Every day | ORAL | Status: DC
  Administered 2019-09-01 – 2019-09-04 (×4): 10 mg via ORAL
  Filled 2019-09-01 (×4): qty 1

## 2019-09-01 MED ORDER — METOPROLOL SUCCINATE ER 50 MG PO TB24: 100.0000 mg | ORAL_TABLET | Freq: Every day | ORAL | Status: DC

## 2019-09-01 MED ORDER — SODIUM CHLORIDE 0.9 % IV SOLN
2.0000 g | INTRAVENOUS | Status: DC
  Administered 2019-09-01 – 2019-09-04 (×4): 2 g via INTRAVENOUS
  Filled 2019-09-01 (×2): qty 20
  Filled 2019-09-01 (×2): qty 2
  Filled 2019-09-01: qty 20

## 2019-09-01 MED ORDER — SODIUM CHLORIDE 0.9 % IV SOLN
INTRAVENOUS | Status: DC | PRN
  Administered 2019-09-02: 12:00:00 30 mL via INTRAVENOUS

## 2019-09-01 MED ORDER — MORPHINE SULFATE (PF) 2 MG/ML IV SOLN
1.0000 mg | INTRAVENOUS | Status: DC | PRN
  Administered 2019-09-01: 1 mg via INTRAVENOUS
  Filled 2019-09-01: qty 1

## 2019-09-01 MED ORDER — METRONIDAZOLE IN NACL 5-0.79 MG/ML-% IV SOLN
500.0000 mg | Freq: Three times a day (TID) | INTRAVENOUS | Status: DC
  Administered 2019-09-01 – 2019-09-04 (×11): 500 mg via INTRAVENOUS
  Filled 2019-09-01 (×13): qty 100

## 2019-09-01 MED ORDER — PEG 3350-KCL-NA BICARB-NACL 420 G PO SOLR
4000.0000 mL | Freq: Once | ORAL | Status: AC
  Administered 2019-09-01: 4000 mL via ORAL
  Filled 2019-09-01 (×2): qty 4000

## 2019-09-01 MED ORDER — VITAMIN D 25 MCG (1000 UNIT) PO TABS
2000.0000 [IU] | ORAL_TABLET | Freq: Every day | ORAL | Status: DC
  Administered 2019-09-01 – 2019-09-04 (×4): 2000 [IU] via ORAL
  Filled 2019-09-01 (×4): qty 2

## 2019-09-01 MED ORDER — ONDANSETRON HCL 4 MG/2ML IJ SOLN
4.0000 mg | Freq: Four times a day (QID) | INTRAMUSCULAR | Status: DC | PRN
  Administered 2019-09-01 – 2019-09-02 (×2): 4 mg via INTRAVENOUS
  Filled 2019-09-01 (×2): qty 2

## 2019-09-01 MED ORDER — ATORVASTATIN CALCIUM 20 MG PO TABS
40.0000 mg | ORAL_TABLET | Freq: Every day | ORAL | Status: DC
  Administered 2019-09-01 – 2019-09-03 (×3): 40 mg via ORAL
  Filled 2019-09-01 (×3): qty 2

## 2019-09-01 MED ORDER — SODIUM CHLORIDE 0.9 % IV SOLN: INTRAVENOUS | Status: DC

## 2019-09-01 MED ORDER — TRAZODONE HCL 50 MG PO TABS: 50.0000 mg | ORAL_TABLET | Freq: Every evening | ORAL | Status: DC | PRN

## 2019-09-01 MED ORDER — SODIUM CHLORIDE 0.9 % IV BOLUS
500.0000 mL | Freq: Once | INTRAVENOUS | Status: AC
  Administered 2019-09-01: 500 mL via INTRAVENOUS

## 2019-09-01 MED ORDER — MAGNESIUM CITRATE PO SOLN
1.0000 | Freq: Once | ORAL | Status: AC
  Administered 2019-09-01: 1 via ORAL
  Filled 2019-09-01: qty 296

## 2019-09-01 MED ORDER — METOPROLOL SUCCINATE ER 100 MG PO TB24
100.0000 mg | ORAL_TABLET | Freq: Every day | ORAL | Status: DC
  Administered 2019-09-01 – 2019-09-03 (×3): 100 mg via ORAL
  Filled 2019-09-01: qty 2
  Filled 2019-09-01 (×2): qty 1

## 2019-09-01 MED ORDER — MORPHINE SULFATE (PF) 2 MG/ML IV SOLN
INTRAVENOUS | Status: AC
  Administered 2019-09-01: 02:00:00 1 mg via INTRAVENOUS
  Filled 2019-09-01: qty 1

## 2019-09-01 MED ORDER — POLYETHYLENE GLYCOL 3350 17 GM/SCOOP PO POWD
1.0000 | Freq: Once | ORAL | Status: DC
  Filled 2019-09-01: qty 255

## 2019-09-01 MED ORDER — PANTOPRAZOLE SODIUM 40 MG PO TBEC
40.0000 mg | DELAYED_RELEASE_TABLET | Freq: Every day | ORAL | Status: DC
  Administered 2019-09-01 – 2019-09-04 (×4): 40 mg via ORAL
  Filled 2019-09-01 (×4): qty 1

## 2019-09-01 MED ORDER — MORPHINE SULFATE (PF) 2 MG/ML IV SOLN
2.0000 mg | INTRAVENOUS | Status: DC | PRN
  Administered 2019-09-01 – 2019-09-02 (×3): 2 mg via INTRAVENOUS
  Filled 2019-09-01 (×3): qty 1

## 2019-09-01 MED ORDER — IRBESARTAN 150 MG PO TABS
300.0000 mg | ORAL_TABLET | Freq: Every day | ORAL | Status: DC
  Administered 2019-09-01 – 2019-09-04 (×4): 300 mg via ORAL
  Filled 2019-09-01 (×4): qty 2

## 2019-09-01 NOTE — H&P (Addendum)
History and Physical    Kerry Jefferson CMK:[id number] DOB: 1945/07/14 DOA: 08/31/2019  PCP: Idelle Crouch, MD  Patient coming from: Home, husband at bedside  I have personally briefly reviewed patient's old medical records in [place]  Chief Complaint: Abdominal pain  HPI: Kerry Jefferson is a 74 y.o. female with medical history significant for recurrent diverticulitis, history of C. difficile, hypertension hyperlipidemia who presents with concerns of worsening left lower quadrant pain.  About 30 minutes following eating lunch today patient noted worsening lower abdominal cramping.  She also had an episode of vomiting.  Also had 2-3 episodes of diarrhea today and noted bright red blood per rectum.  She does havehemorrhoids but feels that this is more than her usual bleeding that she gets with her hemorrhoids.  She has had 3 episodes of diverticulitis in the past 6 months and recently finished a course of Augmentin a few days ago.  She is followed by GI outpatient and they have suggested a potential sigmoid colectomy in the future if her symptoms persist. She denies any fever but has been diaphoretic. Previous abdominal surgery includes cholecystectomy and partial hysterectomy.  She denies any tobacco or illicit drug use.  Occasional alcohol use  ED Course: SHe was afebrile and mildly hypertensive up to 160s on room air.  Leukocytosis up to 20K.  Potassium of 3.4, glucose of 118, creatinine of 1.06 from a prior of 0.92.  Lactic acid of 1.2.  CT abdomen pelvis showed previously inflammatory changes at the mid to distal sigmoid colon have improved.  However there is new interim development of wall thickening and inflammatory changes involving the splenic flexure, descending colon and proximal sigmoid colon.  Findings are suggestive of colitis, inflammatory or ischemic etiology.  No evidence of bowel obstruction.  No perforation or abscess.  There is also incidental finding of right  adnexal cyst.  Review of Systems:  Constitutional: No Weight Change, No Fever ENT/Mouth: No sore throat, No Rhinorrhea Eyes: No Eye Pain, No Vision Changes Cardiovascular: No Chest Pain, no SOB Respiratory: No Cough, No Sputum Gastrointestinal: + Nausea, + Vomiting, + Diarrhea, No Constipation, + Pain Genitourinary: no Urinary Incontinence, No Urgency, No Flank Pain Musculoskeletal: No Arthralgias, No Myalgias Skin: No Skin Lesions, No Pruritus, Neuro: no Weakness, No Numbness Psych: No Anxiety/Panic, No Depression, + decrease appetite Heme/Lymph: No Bruising, No Bleeding  Past Medical History:  Diagnosis Date  . Cancer ([place])    skin  . GERD (gastroesophageal reflux disease)   . Hyperlipidemia   . Hypertension   . Tendinitis     Past Surgical History:  Procedure Laterality Date  . ABDOMINAL HYSTERECTOMY    . BREAST BIOPSY Left 2003   negative  . CHOLECYSTECTOMY    . COLONOSCOPY WITH PROPOFOL N/A 10/22/2015   Procedure: COLONOSCOPY WITH PROPOFOL;  Surgeon: Manya Silvas, MD;  Location[facility] ENDOSCOPY;  Service: Endoscopy;  Laterality: N/A;  . TONSILLECTOMY       reports that she has quit smoking. She has never used smokeless tobacco. She reports that she does not drink alcohol or use drugs.  Allergies  Allergen Reactions  . Atenolol Other (See Comments)    fatigue  . Ciprofloxacin Other (See Comments)    Caused drug induced hepatitis / c-diff  . Ciprofloxacin Hcl Other (See Comments)    Hepatitis  . Lisinopril Other (See Comments)    Fatigue  . Moxifloxacin Other (See Comments)    Hepatitis  . Sertraline Other (See Comments)  Family History  Problem Relation Age of Onset  . Breast cancer Maternal Grandmother 80     Prior to Admission medications   Medication Sig Start Date End Date Taking? Authorizing Provider  cholecalciferol (VITAMIN D) 25 MCG (1000 UNIT) tablet Take 2,000 Units by mouth daily.   Yes [provider]  Coenzyme Q10 (COQ-10  PO) Take 1 tablet by mouth daily.   Yes [provider]  desonide (DESOWEN) 0.05 % cream  08/25/19  Yes [provider]  escitalopram (LEXAPRO) 10 MG tablet Take 1 tablet by mouth daily. 08/12/18  Yes [provider]  esomeprazole (NEXIUM) 20 MG capsule Take 20 mg by mouth as needed.  08/05/12  Yes [provider]  fluticasone (FLONASE) 50 MCG/ACT nasal spray USE TWO SPRAY(S) IN EACH NOSTRIL ONCE DAILY. 06/07/18  Yes [provider]  folic acid (FOLVITE) 387 MCG tablet Take by mouth.   Yes [provider]  ketoconazole (NIZORAL) 2 % cream  08/25/19  Yes [provider]  olmesartan-hydrochlorothiazide (BENICAR HCT) 40-12.5 MG tablet Take 1 tablet by mouth daily. 07/30/19  Yes [provider]  simvastatin (ZOCOR) 80 MG tablet Take 40 mg by mouth daily at 6 PM.  04/07/13  Yes [provider]  TOPROL XL 100 MG 24 hr tablet Take 100 mg by mouth daily.  03/04/13  Yes [provider]  traZODone (DESYREL) 50 MG tablet Take 50 mg by mouth at bedtime as needed.  04/25/18  Yes [provider]    Physical Exam: Vitals:   08/31/19 1700 08/31/19 2318  BP: (!) 151/57 (!) 167/67  Pulse: (!) 59 60  Resp: 18 18  Temp: 97.6 F (36.4 C)   TempSrc: Oral   SpO2: 99% 96%  Weight: 84.8 kg   Height: 5' 5"  (1.651 m)     Constitutional: NAD, calm, comfortable, nontoxic female laying flat in bed Vitals:   08/31/19 1700 08/31/19 2318  BP: (!) 151/57 (!) 167/67  Pulse: (!) 59 60  Resp: 18 18  Temp: 97.6 F (36.4 C)   TempSrc: Oral   SpO2: 99% 96%  Weight: 84.8 kg   Height: 5' 5"  (1.651 m)    Eyes: PERRL, lids and conjunctivae normal ENMT: Mucous membranes are moist.  Neck: normal, supple Respiratory: clear to auscultation bilaterally, no wheezing, no crackles. Normal respiratory effort on room air. No accessory muscle use.  Cardiovascular: Regular rate and rhythm, no murmurs / rubs / gallops. No extremity edema.    Abdomen: Moderate tenderness to left lower quadrant without any guarding, rigidity or rebound tenderness, no masses palpated. Bowel sounds positive.  Musculoskeletal: no clubbing / cyanosis. No joint deformity upper and lower extremities. Good ROM, no contractures. Normal muscle tone.  Skin: no rashes, lesions, ulcers. No induration Neurologic: CN 2-12 grossly intact. Sensation intact. Strength 5/5 in all 4.  Psychiatric: Normal judgment and insight. Alert and oriented x 3. Normal mood.     Labs on Admission: I have personally reviewed following labs and imaging studies  CBC: Recent Labs  Lab 08/31/19 1713  WBC 20.0*  HGB 13.9  HCT 42.0  MCV 87.7  PLT 564   Basic Metabolic Panel: Recent Labs  Lab 08/31/19 1713  NA 138  K 3.4*  CL 101  CO2 27  GLUCOSE 118*  BUN 24*  CREATININE 1.06*  CALCIUM 9.0   GFR: Estimated Creatinine Clearance: 49.3 mL/min (A) (by C-G formula based on SCr of 1.06 mg/dL (H)). Liver Function Tests: Recent Labs  Lab 08/31/19 1713  AST 24  ALT 20  ALKPHOS 62  BILITOT 0.7  PROT 7.0  ALBUMIN 4.2   Recent Labs  Lab 08/31/19 1713  LIPASE 45   No results for input(s): AMMONIA in the last 168 hours. Coagulation Profile: No results for input(s): INR, PROTIME in the last 168 hours. Cardiac Enzymes: No results for input(s): CKTOTAL, CKMB, CKMBINDEX, TROPONINI in the last 168 hours. BNP (last 3 results) No results for input(s): PROBNP in the last 8760 hours. HbA1C: No results for input(s): HGBA1C in the last 72 hours. CBG: No results for input(s): GLUCAP in the last 168 hours. Lipid Profile: No results for input(s): CHOL, HDL, LDLCALC, TRIG, CHOLHDL, LDLDIRECT in the last 72 hours. Thyroid Function Tests: No results for input(s): TSH, T4TOTAL, FREET4, T3FREE, THYROIDAB in the last 72 hours. Anemia Panel: No results for input(s): VITAMINB12, FOLATE, FERRITIN, TIBC, IRON, RETICCTPCT in the last 72 hours. Urine analysis:    Component Value  Date/Time   COLORURINE AMBER (A) 08/31/2019 1713   APPEARANCEUR HAZY (A) 08/31/2019 1713   LABSPEC 1.021 08/31/2019 1713   PHURINE 5.0 08/31/2019 1713   GLUCOSEU NEGATIVE 08/31/2019 1713   HGBUR NEGATIVE 08/31/2019 1713   BILIRUBINUR NEGATIVE 08/31/2019 1713   KETONESUR 5 (A) 08/31/2019 1713   PROTEINUR NEGATIVE 08/31/2019 1713   NITRITE NEGATIVE 08/31/2019 1713   LEUKOCYTESUR NEGATIVE 08/31/2019 1713    Radiological Exams on Admission: CT ABDOMEN PELVIS W CONTRAST  Result Date: 08/31/2019 CLINICAL DATA:  History of diverticulitis, abdominal cramping with bleeding and vomiting EXAM: CT ABDOMEN AND PELVIS WITH CONTRAST TECHNIQUE: Multidetector CT imaging of the abdomen and pelvis was performed using the standard protocol following bolus administration of intravenous contrast. CONTRAST:  177m OMNIPAQUE IOHEXOL 300 MG/ML  SOLN COMPARISON:  CT 04/03/2019 FINDINGS: Lower chest: Lung bases demonstrate no acute consolidation or pleural effusion. Heart size upper limits of normal. Hepatobiliary: No focal liver abnormality is seen. Status post cholecystectomy. No biliary dilatation. Pancreas: Unremarkable. No pancreatic ductal dilatation or surrounding inflammatory changes. Spleen: Normal in size without focal abnormality. Adrenals/Urinary Tract: Subcentimeter hypodensity in the lower pole right kidney. Adrenal glands are normal. No hydronephrosis. The bladder is normal Stomach/Bowel: Stomach is nonenlarged. No dilated small bowel. Negative appendix. The previously noted inflammatory changes at the sigmoid colon have improved. Now seen are new inflammatory changes involving the splenic flexure, descending colon and proximal sigmoid colon. No extraluminal gas. Vascular/Lymphatic: Mild aortic atherosclerosis. No aneurysm. No suspicious adenopathy Reproductive: Status post hysterectomy. 3.2 cm right adnexal cyst, unchanged since 04/03/2019. Other: Negative for free air or free fluid. Musculoskeletal: No acute  or suspicious osseous abnormality IMPRESSION: 1. Previously noted inflammatory changes at the mid to distal sigmoid colon have improved. There is however interim development of wall thickening and inflammatory change involving the splenic flexure, descending colon and proximal sigmoid colon. Although there are diverticula present at the proximal sigmoid colon, given length of involvement, suspect that the findings are related to colitis of infectious, inflammatory, or ischemic etiology. There is no evidence for a bowel obstruction. Negative for perforation or abscess. 2. 3.2 cm right adnexal cyst. Recommend follow-up pelvic ultrasound. This may be performed on a nonemergent basis. Electronically Signed   By: KDonavan FoilM.D.   On: 08/31/2019 22:48    EKG: Independently reviewed.   Assessment/Plan  Colitis with history of recurrent diverticulitis Surprisingly patient's diverticulitis has resolved but now has colitis higher in the splenic flexure in descending colon GI pathogen panel and stool studies including  C. difficile are pending Will treat with IV Rocephin and Flagyl since patient had C. difficile from ciprofloxacin in the past Consider GI consult in the morning  AKI Follow after fluids Avoid nephrotoxic agent  Incidental finding of right adnexal cyst 3.2 cm Recommend outpatient pelvic ultrasound  Hypokalemia Replete  Hypertension hold olmesartan-HCTZ due to AKI   HLD  continue statin   DVT prophylaxis:.Lovenox Code Status: Full Family Communication: Plan discussed with patient at bedside  disposition Plan: Home with at least 2 midnight stays  Consults called:  Admission status: inpatient    Kerry Jefferson T Christinamarie Tall DO [facility]   If 7PM-7AM, please contact night-coverage [email or web address]   09/01/2019, 1:21 AM

## 2019-09-01 NOTE — Consult Note (Signed)
Cephas Darby, MD [address]  [place]  [place], [place] [address]  Main: [phone]  Fax: [phone] Pager: [phone]   Consultation  Referring Provider:     No ref. provider found Primary Care Physician:  Idelle Crouch, MD Primary Gastroenterologist:  Dr. Alice Reichert        Reason for Consultation: Lower abdominal pain, abnormal CT  Date of Admission:  08/31/2019 Date of Consultation:  09/01/2019         HPI:   Kerry Jefferson is a 74 y.o. female with history of hypertension, hyperlipidemia who is admitted with recurrence of lower abdominal pain, worse in left lower abdomen, associated with significant leukocytosis.  Patient has history of recurrent episodes of left lower abdominal pain, initial episode in 03/2019, was treated for acute sigmoid diverticulitis.  Since then, she has been followed by Dr. Alice Reichert as outpatient, was treated with 2 rounds of antibiotics since then.  During this admission, CT revealed diffuse inflammation extending from splenic flexure to descending and proximal sigmoid colon with improvement of inflammation in the sigmoid colon.  WBC count 20 K, improved to 16.6 today.  She is empirically started on antibiotics and GI is consulted for further management.  Patient reports that she also noticed blood in her stools for the first time, associated with postprandial lower abdominal cramps, diarrhea, subjective fever.  She underwent stool studies to rule out infectious etiology which were negative.  She does not smoke or drink alcohol  NSAIDs: None  Antiplts/Anticoagulants/Anti thrombotics: None  GI Procedures:  - CSY: --10/2018: severediverticulosis in the left colon with no evidence of diverticular bleeding, mild diverticulosis in the right colon with no evidence of diverticular bleeding, non-bleed internal hemorrhoids, otherwise normal examined colon --10/2015:1 diminutive polyp at rectosigmoid colon, 3 diminutive polyps in the ascending  colon. Pathology showing tubular adenoma x3, hyperplastic polyp. Diverticulosis in sigmoid, descending, transverse, and ascending colon, not having bleeding internal hemorrhoids - EGD: --02/2017:Lower esophageal B ring with dilatation performed with Savary dilator with no resistance at 60 Fr   Past Medical History:  Diagnosis Date  . Cancer ([place])    skin  . GERD (gastroesophageal reflux disease)   . Hyperlipidemia   . Hypertension   . Tendinitis     Past Surgical History:  Procedure Laterality Date  . ABDOMINAL HYSTERECTOMY    . BREAST BIOPSY Left 2003   negative  . CHOLECYSTECTOMY    . COLONOSCOPY WITH PROPOFOL N/A 10/22/2015   Procedure: COLONOSCOPY WITH PROPOFOL;  Surgeon: Manya Silvas, MD;  Location: [facility] ENDOSCOPY;  Service: Endoscopy;  Laterality: N/A;  . TONSILLECTOMY      Prior to Admission medications   Medication Sig Start Date End Date Taking? Authorizing Provider  cholecalciferol (VITAMIN D) 25 MCG (1000 UNIT) tablet Take 2,000 Units by mouth daily.   Yes [provider]  Coenzyme Q10 (COQ-10 PO) Take 1 tablet by mouth daily.   Yes [provider]  desonide (DESOWEN) 0.05 % cream  08/25/19  Yes [provider]  escitalopram (LEXAPRO) 10 MG tablet Take 1 tablet by mouth daily. 08/12/18  Yes [provider]  esomeprazole (NEXIUM) 20 MG capsule Take 20 mg by mouth as needed.  08/05/12  Yes [provider]  fluticasone (FLONASE) 50 MCG/ACT nasal spray USE TWO SPRAY(S) IN EACH NOSTRIL ONCE DAILY. 06/07/18  Yes [provider]  folic acid (FOLVITE) 326 MCG tablet Take by mouth.   Yes [provider]  ketoconazole (NIZORAL)  2 % cream  08/25/19  Yes [provider]  olmesartan-hydrochlorothiazide (BENICAR HCT) 40-12.5 MG tablet Take 1 tablet by mouth daily. 07/30/19  Yes [provider]  simvastatin (ZOCOR) 80 MG tablet Take 40 mg by mouth daily at 6 PM.  04/07/13  Yes [provider]    TOPROL XL 100 MG 24 hr tablet Take 100 mg by mouth daily.  03/04/13  Yes [provider]  traZODone (DESYREL) 50 MG tablet Take 50 mg by mouth at bedtime as needed.  04/25/18  Yes [provider]    Current Facility-Administered Medications:  .  0.9 %  sodium chloride infusion, , Intravenous, PRN, Tu, Ching T, DO .  0.9 %  sodium chloride infusion, , Intravenous, Continuous, Zoie Sarin, Tally Due, MD, Last Rate: 20 mL/hr at 09/01/19 1753, New Bag at 09/01/19 1753 .  atorvastatin (LIPITOR) tablet 40 mg, 40 mg, Oral, q1800, Tu, Ching T, DO, 40 mg at 09/01/19 1749 .  cefTRIAXone (ROCEPHIN) 2 g in sodium chloride 0.9 % 100 mL IVPB, 2 g, Intravenous, Q24H, Tu, Ching T, DO, Stopped at 09/01/19 0245 .  cholecalciferol (VITAMIN D3) tablet 2,000 Units, 2,000 Units, Oral, Daily, Tu, Ching T, DO, 2,000 Units at 09/01/19 1133 .  enoxaparin (LOVENOX) injection 40 mg, 40 mg, Subcutaneous, Q24H, Tu, Ching T, DO, 40 mg at 09/01/19 0547 .  escitalopram (LEXAPRO) tablet 10 mg, 10 mg, Oral, Daily, Tu, Ching T, DO, 10 mg at 09/01/19 1134 .  folic acid (FOLVITE) tablet 1 mg, 1 mg, Oral, Daily, Tu, Ching T, DO, 1 mg at 09/01/19 1134 .  irbesartan (AVAPRO) tablet 300 mg, 300 mg, Oral, Daily, Lorella Nimrod, MD, 300 mg at 09/01/19 1553 .  metoprolol succinate (TOPROL-XL) 24 hr tablet 100 mg, 100 mg, Oral, Daily, Tu, Ching T, DO, 100 mg at 09/01/19 0437 .  metroNIDAZOLE (FLAGYL) IVPB 500 mg, 500 mg, Intravenous, Q8H, Tu, Ching T, DO, Last Rate: 100 mL/hr at 09/01/19 1754, 500 mg at 09/01/19 1754 .  morphine 2 MG/ML injection 2 mg, 2 mg, Intravenous, Q2H PRN, Sharion Settler, NP, 2 mg at 09/01/19 2040 .  ondansetron (ZOFRAN) injection 4 mg, 4 mg, Intravenous, Q6H PRN, Tu, Ching T, DO, 4 mg at 09/01/19 1840 .  pantoprazole (PROTONIX) EC tablet 40 mg, 40 mg, Oral, Daily, Tu, Ching T, DO, 40 mg at 09/01/19 1134 .  sodium chloride flush (NS) 0.9 % injection 3 mL, 3 mL, Intravenous, Once, Vanessa Northwood, MD .   traZODone (DESYREL) tablet 50 mg, 50 mg, Oral, QHS PRN, Tu, Ching T, DO  Family History  Problem Relation Age of Onset  . Breast cancer Maternal Grandmother 11     Social History   Tobacco Use  . Smoking status: Former Research scientist (life sciences)  . Smokeless tobacco: Never Used  . Tobacco comment: quit 10 years ago  Substance Use Topics  . Alcohol use: No  . Drug use: No    Allergies as of 08/31/2019 - Review Complete 08/31/2019  Allergen Reaction Noted  . Atenolol Other (See Comments) 06/15/2015  . Ciprofloxacin Other (See Comments) 04/29/2013  . Ciprofloxacin hcl Other (See Comments) 06/15/2015  . Lisinopril Other (See Comments) 06/15/2015  . Moxifloxacin Other (See Comments) 06/15/2015  . Sertraline Other (See Comments) 06/15/2015    Review of Systems:    All systems reviewed and negative except where noted in HPI.   Physical Exam:  Vital signs in last 24 hours: Temp:  [97.9 F (36.6 C)-99.2 F (37.3 C)] 98.7 F (  37.1 C) (04/12 2012) Pulse Rate:  [56-71] 57 (04/12 2012) Resp:  [16-20] 20 (04/12 2012) BP: (139-159)/(59-72) 139/71 (04/12 2012) SpO2:  [94 %-98 %] 94 % (04/12 2012)   General:   Pleasant, cooperative in NAD Head:  Normocephalic and atraumatic. Eyes:   No icterus.   Conjunctiva pink. PERRLA. Ears:  Normal auditory acuity. Neck:  Supple; no masses or thyroidomegaly Lungs: Respirations even and unlabored. Lungs clear to auscultation bilaterally.   No wheezes, crackles, or rhonchi.  Heart:  Regular rate and rhythm;  Without murmur, clicks, rubs or gallops Abdomen:  Soft, nondistended, moderate tenderness in lower abdomen, voluntary guarding. Normal bowel sounds. No appreciable masses or hepatomegaly.  No rebound or guarding.  Rectal:  Not performed. Msk:  Symmetrical without gross deformities.  Strength generalized weakness Extremities:  Without edema, cyanosis or clubbing. Neurologic:  Alert and oriented x3;  grossly normal neurologically. Skin:  Intact without  significant lesions or rashes. Psych:  Alert and cooperative. Normal affect.  LAB RESULTS: CBC Latest Ref Rng & Units 09/01/2019 08/31/2019 07/09/2018  WBC 4.0 - 10.5 K/uL 16.6(H) 20.0(H) 12.7(H)  Hemoglobin 12.0 - 15.0 g/dL 12.3 13.9 12.6  Hematocrit 36.0 - 46.0 % 36.7 42.0 38.8  Platelets 150 - 400 K/uL 169 193 228    BMET BMP Latest Ref Rng & Units 09/01/2019 08/31/2019 07/09/2018  Glucose 70 - 99 mg/dL 116(H) 118(H) 103(H)  BUN 8 - 23 mg/dL 22 24(H) 18  Creatinine 0.44 - 1.00 mg/dL 0.84 1.06(H) 0.92  Sodium 135 - 145 mmol/L 139 138 133(L)  Potassium 3.5 - 5.1 mmol/L 3.6 3.4(L) 3.4(L)  Chloride 98 - 111 mmol/L 104 101 97(L)  CO2 22 - 32 mmol/L _0 Calcium 8.9 - 10.3 mg/dL 7.7(L) 9.0 8.8(L)    LFT Hepatic Function Latest Ref Rng & Units 08/31/2019 09/02/2015  Total Protein 6.5 - 8.1 g/dL 7.0 7.0  Albumin 3.5 - 5.0 g/dL 4.2 4.4  AST 15 - 41 U/L 24 20  ALT 0 - 44 U/L 20 20  Alk Phosphatase 38 - 126 U/L 62 57  Total Bilirubin 0.3 - 1.2 mg/dL 0.7 0.8  Bilirubin, Direct 0.1 - 0.5 mg/dL - 0.1     STUDIES: CT ABDOMEN PELVIS W CONTRAST  Result Date: 08/31/2019 CLINICAL DATA:  History of diverticulitis, abdominal cramping with bleeding and vomiting EXAM: CT ABDOMEN AND PELVIS WITH CONTRAST TECHNIQUE: Multidetector CT imaging of the abdomen and pelvis was performed using the standard protocol following bolus administration of intravenous contrast. CONTRAST:  119m OMNIPAQUE IOHEXOL 300 MG/ML  SOLN COMPARISON:  CT 04/03/2019 FINDINGS: Lower chest: Lung bases demonstrate no acute consolidation or pleural effusion. Heart size upper limits of normal. Hepatobiliary: No focal liver abnormality is seen. Status post cholecystectomy. No biliary dilatation. Pancreas: Unremarkable. No pancreatic ductal dilatation or surrounding inflammatory changes. Spleen: Normal in size without focal abnormality. Adrenals/Urinary Tract: Subcentimeter hypodensity in the lower pole right kidney. Adrenal glands are  normal. No hydronephrosis. The bladder is normal Stomach/Bowel: Stomach is nonenlarged. No dilated small bowel. Negative appendix. The previously noted inflammatory changes at the sigmoid colon have improved. Now seen are new inflammatory changes involving the splenic flexure, descending colon and proximal sigmoid colon. No extraluminal gas. Vascular/Lymphatic: Mild aortic atherosclerosis. No aneurysm. No suspicious adenopathy Reproductive: Status post hysterectomy. 3.2 cm right adnexal cyst, unchanged since 04/03/2019. Other: Negative for free air or free fluid. Musculoskeletal: No acute or suspicious osseous abnormality IMPRESSION: 1. Previously noted inflammatory changes at the mid to distal sigmoid  colon have improved. There is however interim development of wall thickening and inflammatory change involving the splenic flexure, descending colon and proximal sigmoid colon. Although there are diverticula present at the proximal sigmoid colon, given length of involvement, suspect that the findings are related to colitis of infectious, inflammatory, or ischemic etiology. There is no evidence for a bowel obstruction. Negative for perforation or abscess. 2. 3.2 cm right adnexal cyst. Recommend follow-up pelvic ultrasound. This may be performed on a nonemergent basis. Electronically Signed   By: Donavan Foil M.D.   On: 08/31/2019 22:48      Impression / Plan:   Kerry Jefferson is a 74 y.o. female with overweight, hypertension, hyperlipidemia who was initially treated with acute uncomplicated sigmoid diverticulitis 03/2019, followed by recurrence of left-sided nominal pain, treated for recurrent diverticulitis in 05/2019, 07/2019, now admitted with recurrence of lower abdominal pain, diarrhea and hematochezia, leukocytosis.  Stool studies negative for infectious etiology.  CT revealing left-sided colitis  Differentials include segmental colitis associated with diverticulosis or inflammatory bowel disease or  ischemic colitis or recurrent diverticulitis  Continue antibiotics Clear liquid diet Flexible sigmoidoscopy/colonoscopy to evaluate left colon with biopsies.  If there is no evidence of IBD and if her colitis is secondary to recurrent diverticulitis, antibiotics are shown to have minimal benefit Bowel prep today Avoid red meat, processed foods  Thank you for involving me in the care of this patient.  GI will follow along with you    LOS: 1 day   Sherri Sear, MD  09/01/2019, 11:30 PM   Note: This dictation was prepared with Dragon dictation along with smaller phrase technology. Any transcriptional errors that result from this process are unintentional.

## 2019-09-01 NOTE — Progress Notes (Signed)
PROGRESS NOTE    Kerry Jefferson  JOI:[id number] DOB: 09/13/1945 DOA: 08/31/2019 PCP: Idelle Crouch, MD   Brief Narrative:   Kerry Jefferson is a 74 y.o. female with medical history significant for recurrent diverticulitis, history of C. difficile, hypertension hyperlipidemia who presents with concerns of worsening left lower quadrant pain.  About 30 minutes following eating lunch today patient noted worsening lower abdominal cramping.  She also had an episode of vomiting.  Also had 2-3 episodes of diarrhea today and noted bright red blood per rectum.  She does havehemorrhoids but feels that this is more than her usual bleeding that she gets with her hemorrhoids.  She has had 3 episodes of diverticulitis in the past 6 months and recently finished a course of Augmentin a few days ago.  She is followed by GI outpatient and they have suggested a potential sigmoid colectomy in the future if her symptoms persist. CT abdomen with inflammatory changes involving the splenic flexure, descending colon and proximal sigmoid colon, most suggestive of colitis.  Subjective: Continues to experience left lower quadrant pain.  No nausea or vomiting.  No diarrhea or bleeding per rectum since hospitalization.  She was accompanied by her husband in the room.  She was told by Dr. Alice Reichert that if she continued to experience these episodes then she might need a sigmoidal colectomy, she was asking to involve GI.  Assessment & Plan:   Principal Problem:   Colitis Active Problems:   Cyst of right ovary   HLD (hyperlipidemia)   Essential (primary) hypertension   AKI (acute kidney injury) (HCC)   Hypokalemia  Colitis with history of recurrent diverticulitis Surprisingly patient's diverticulitis has resolved but now has colitis higher in the splenic flexure in descending colon mild sigmoid colon.  Continue to have tenderness in left lower quadrant.  No nausea, vomiting or diarrhea since hospitalization.  C.  difficile and GI pathogen panel was negative.  She was requesting to see Dr. Huntley Dec was sent to him-appreciate their recommendations. -Continue Rocephin and Flagyl.  AKI.  Resolved. -Monitor renal functions.  Hypokalemia.  Resolved -Monitor electrolytes, replete as needed.  Hypertension.  Blood pressure elevated today. Home dose of olmesartan and HCTZ was held due to AKI. -Restart home dose of olmesartan.  Hyperlipidemia. -Continue statin  Objective: Vitals:   09/01/19 0300 09/01/19 0400 09/01/19 0437 09/01/19 1138  BP: (!) 148/63 (!) 146/66 (!) 154/71 (!) 155/60  Pulse: 60 64 71 70  Resp: 16 16  18   Temp:    98.9 F (37.2 C)  TempSrc:    Oral  SpO2: 94% 95%  98%  Weight:      Height:        Intake/Output Summary (Last 24 hours) at 09/01/2019 1446 Last data filed at 09/01/2019 0547 Gross per 24 hour  Intake 1793.46 ml  Output --  Net 1793.46 ml   Filed Weights   08/31/19 1700  Weight: 84.8 kg    Examination:  General exam: Appears calm and comfortable  Respiratory system: Clear to auscultation. Respiratory effort normal. Cardiovascular system: S1 & S2 heard, RRR. No JVD, murmurs, rubs, gallops or clicks. Gastrointestinal system: Soft, left lower quadrant tenderness, nondistended, bowel sounds positive. Central nervous system: Alert and oriented. No focal neurological deficits.Symmetric 5 x 5 power. Extremities: No edema, no cyanosis, pulses intact and symmetrical. Psychiatry: Judgement and insight appear normal. Mood & affect appropriate.    DVT prophylaxis: Lovenox Code Status: Full Family Communication: Husband was updated at bedside Disposition Plan:  Status is: Inpatient  Remains inpatient appropriate because:Inpatient level of care appropriate due to severity of illness   Dispo: The patient is from: Home              Anticipated d/c is to: Home              Anticipated d/c date is: 2 days              Patient currently is not medically  stable to d/c.   Consultants:   GI  Procedures:  Antimicrobials:  Rocephin Flagyl  Data Reviewed: I have personally reviewed following labs and imaging studies  CBC: Recent Labs  Lab 08/31/19 1713 09/01/19 0527  WBC 20.0* 16.6*  HGB 13.9 12.3  HCT 42.0 36.7  MCV 87.7 87.2  PLT 193 283   Basic Metabolic Panel: Recent Labs  Lab 08/31/19 1713 09/01/19 0527  NA 138 139  K 3.4* 3.6  CL 101 104  CO2 27 27  GLUCOSE 118* 116*  BUN 24* 22  CREATININE 1.06* 0.84  CALCIUM 9.0 7.7*   GFR: Estimated Creatinine Clearance: 62.2 mL/min (by C-G formula based on SCr of 0.84 mg/dL). Liver Function Tests: Recent Labs  Lab 08/31/19 1713  AST 24  ALT 20  ALKPHOS 62  BILITOT 0.7  PROT 7.0  ALBUMIN 4.2   Recent Labs  Lab 08/31/19 1713  LIPASE 45   No results for input(s): AMMONIA in the last 168 hours. Coagulation Profile: No results for input(s): INR, PROTIME in the last 168 hours. Cardiac Enzymes: No results for input(s): CKTOTAL, CKMB, CKMBINDEX, TROPONINI in the last 168 hours. BNP (last 3 results) No results for input(s): PROBNP in the last 8760 hours. HbA1C: No results for input(s): HGBA1C in the last 72 hours. CBG: No results for input(s): GLUCAP in the last 168 hours. Lipid Profile: No results for input(s): CHOL, HDL, LDLCALC, TRIG, CHOLHDL, LDLDIRECT in the last 72 hours. Thyroid Function Tests: No results for input(s): TSH, T4TOTAL, FREET4, T3FREE, THYROIDAB in the last 72 hours. Anemia Panel: No results for input(s): VITAMINB12, FOLATE, FERRITIN, TIBC, IRON, RETICCTPCT in the last 72 hours. Sepsis Labs: Recent Labs  Lab 08/31/19 2202  LATICACIDVEN 1.2    Recent Results (from the past 240 hour(s))  Blood culture (routine x 2)     Status: None (Preliminary result)   Collection Time: 08/31/19 10:02 PM   Specimen: BLOOD  Result Value Ref Range Status   Specimen Description BLOOD RIGHT [facility]  Final   Special Requests   Final    BOTTLES DRAWN AEROBIC  AND ANAEROBIC Blood Culture adequate volume   Culture   Final    NO GROWTH < 12 HOURS Performed at [facility], [address] [place], [place] [address]    Report Status PENDING  Incomplete  Blood culture (routine x 2)     Status: None (Preliminary result)   Collection Time: 08/31/19 10:24 PM   Specimen: BLOOD  Result Value Ref Range Status   Specimen Description BLOOD RIGHT ARM  Final   Special Requests   Final    BOTTLES DRAWN AEROBIC AND ANAEROBIC Blood Culture adequate volume   Culture   Final    NO GROWTH < 12 HOURS Performed at [facility], [address], [place], [place] [address]    Report Status PENDING  Incomplete  Gastrointestinal Panel by PCR , Stool     Status: None   Collection Time: 09/01/19 12:25 AM   Specimen: Nasopharyngeal Swab; Stool  Result Value Ref Range Status   Campylobacter species NOT DETECTED NOT DETECTED Final   Plesimonas shigelloides NOT DETECTED NOT DETECTED Final   Salmonella species NOT DETECTED NOT DETECTED Final   Yersinia enterocolitica NOT DETECTED NOT DETECTED Final   Vibrio species NOT DETECTED NOT DETECTED Final   Vibrio cholerae NOT DETECTED NOT DETECTED Final   Enteroaggregative E coli (EAEC) NOT DETECTED NOT DETECTED Final   Enteropathogenic E coli (EPEC) NOT DETECTED NOT DETECTED Final   Enterotoxigenic E coli (ETEC) NOT DETECTED NOT DETECTED Final   Shiga like toxin producing E coli (STEC) NOT DETECTED NOT DETECTED Final   Shigella/Enteroinvasive E coli (EIEC) NOT DETECTED NOT DETECTED Final   Cryptosporidium NOT DETECTED NOT DETECTED Final   Cyclospora cayetanensis NOT DETECTED NOT DETECTED Final   Entamoeba histolytica NOT DETECTED NOT DETECTED Final   Giardia lamblia NOT DETECTED NOT DETECTED Final   Adenovirus F40/41 NOT DETECTED NOT DETECTED Final   Astrovirus NOT DETECTED NOT DETECTED Final   Norovirus GI/GII NOT DETECTED NOT DETECTED Final   Rotavirus A NOT DETECTED NOT DETECTED Final   Sapovirus  (I, II, IV, and V) NOT DETECTED NOT DETECTED Final    Comment: Performed at [facility], [address] [place], [place] [address]  C Difficile Quick Screen w PCR reflex     Status: None   Collection Time: 09/01/19 12:25 AM   Specimen: Nasopharyngeal Swab; Stool  Result Value Ref Range Status   C Diff antigen NEGATIVE NEGATIVE Final   C Diff toxin NEGATIVE NEGATIVE Final   C Diff interpretation No C. difficile detected.  Final    Comment: Performed at [facility], [place], [place] [address]  SARS CORONAVIRUS 2 (TAT 6-24 HRS) Nasopharyngeal Nasopharyngeal Swab     Status: None   Collection Time: 09/01/19 12:25 AM   Specimen: Nasopharyngeal Swab  Result Value Ref Range Status   SARS Coronavirus 2 NEGATIVE NEGATIVE Final    Comment: (NOTE) SARS-CoV-2 target nucleic acids are NOT DETECTED. The SARS-CoV-2 RNA is generally detectable in upper and lower respiratory specimens during the acute phase of infection. Negative results do not preclude SARS-CoV-2 infection, do not rule out co-infections with other pathogens, and should not be used as the sole basis for treatment or other patient management decisions. Negative results must be combined with clinical observations, patient history, and epidemiological information. The expected result is Negative. Fact Sheet for Patients: SugarRoll.be Fact Sheet for Healthcare Providers: https://www.woods-mathews.com/ This test is not yet approved or cleared by the [place] FDA and  has been authorized for detection and/or diagnosis of SARS-CoV-2 by FDA under an Emergency Use Authorization (EUA). This EUA will remain  in effect (meaning this test can be used) for the duration of the COVID-19 declaration under Section 56 4(b)(1) of the Act, 21 U.S.C. section 360bbb-3(b)(1), unless the authorization is terminated or revoked sooner. Performed at [facility],  [place] [address] [place], [place] [address]      Radiology Studies: CT ABDOMEN PELVIS W CONTRAST  Result Date: 08/31/2019 CLINICAL DATA:  History of diverticulitis, abdominal cramping with bleeding and vomiting EXAM: CT ABDOMEN AND PELVIS WITH CONTRAST TECHNIQUE: Multidetector CT imaging of the abdomen and pelvis was performed using the standard protocol following bolus administration of intravenous contrast. CONTRAST:  140m OMNIPAQUE IOHEXOL 300 MG/ML  SOLN COMPARISON:  CT 04/03/2019 FINDINGS: Lower chest: Lung bases demonstrate no acute consolidation or pleural effusion. Heart size upper limits of normal. Hepatobiliary: No focal  liver abnormality is seen. Status post cholecystectomy. No biliary dilatation. Pancreas: Unremarkable. No pancreatic ductal dilatation or surrounding inflammatory changes. Spleen: Normal in size without focal abnormality. Adrenals/Urinary Tract: Subcentimeter hypodensity in the lower pole right kidney. Adrenal glands are normal. No hydronephrosis. The bladder is normal Stomach/Bowel: Stomach is nonenlarged. No dilated small bowel. Negative appendix. The previously noted inflammatory changes at the sigmoid colon have improved. Now seen are new inflammatory changes involving the splenic flexure, descending colon and proximal sigmoid colon. No extraluminal gas. Vascular/Lymphatic: Mild aortic atherosclerosis. No aneurysm. No suspicious adenopathy Reproductive: Status post hysterectomy. 3.2 cm right adnexal cyst, unchanged since 04/03/2019. Other: Negative for free air or free fluid. Musculoskeletal: No acute or suspicious osseous abnormality IMPRESSION: 1. Previously noted inflammatory changes at the mid to distal sigmoid colon have improved. There is however interim development of wall thickening and inflammatory change involving the splenic flexure, descending colon and proximal sigmoid colon. Although there are diverticula present at the proximal sigmoid colon, given length of  involvement, suspect that the findings are related to colitis of infectious, inflammatory, or ischemic etiology. There is no evidence for a bowel obstruction. Negative for perforation or abscess. 2. 3.2 cm right adnexal cyst. Recommend follow-up pelvic ultrasound. This may be performed on a nonemergent basis. Electronically Signed   By: Donavan Foil M.D.   On: 08/31/2019 22:48    Scheduled Meds: . atorvastatin  40 mg Oral q1800  . cholecalciferol  2,000 Units Oral Daily  . enoxaparin (LOVENOX) injection  40 mg Subcutaneous Q24H  . escitalopram  10 mg Oral Daily  . folic acid  1 mg Oral Daily  . metoprolol succinate  100 mg Oral Daily  . pantoprazole  40 mg Oral Daily  . sodium chloride flush  3 mL Intravenous Once   Continuous Infusions: . cefTRIAXone (ROCEPHIN)  IV Stopped (09/01/19 0245)  . metronidazole Stopped (09/01/19 1200)     LOS: 1 day   Time spent: 40 minutes.  Lorella Nimrod, MD [facility]  If 7PM-7AM, please contact night-coverage [email or web address]  09/01/2019, 2:46 PM   This record has been created using Systems analyst. Errors have been sought and corrected,but may not always be located. Such creation errors do not reflect on the standard of care.

## 2019-09-02 ENCOUNTER — Other Ambulatory Visit: Payer: Self-pay

## 2019-09-02 ENCOUNTER — Inpatient Hospital Stay: Payer: Medicare Other | Admitting: Anesthesiology

## 2019-09-02 ENCOUNTER — Encounter: Payer: Self-pay | Admitting: Family Medicine

## 2019-09-02 ENCOUNTER — Encounter
Admission: EM | Disposition: A | Discharge status: Home / Self Care | Payer: Self-pay | Source: Home / Self Care | Attending: Hospitalist

## 2019-09-02 DIAGNOSIS — K6289 Other specified diseases of anus and rectum: Secondary | ICD-10-CM

## 2019-09-02 DIAGNOSIS — R1030 Lower abdominal pain, unspecified: Secondary | ICD-10-CM

## 2019-09-02 DIAGNOSIS — R933 Abnormal findings on diagnostic imaging of other parts of digestive tract: Secondary | ICD-10-CM

## 2019-09-02 DIAGNOSIS — K5150 Left sided colitis without complications: Secondary | ICD-10-CM | POA: Diagnosis not present

## 2019-09-02 DIAGNOSIS — K5730 Diverticulosis of large intestine without perforation or abscess without bleeding: Secondary | ICD-10-CM

## 2019-09-02 DIAGNOSIS — K921 Melena: Secondary | ICD-10-CM | POA: Diagnosis not present

## 2019-09-02 HISTORY — PX: COLONOSCOPY WITH PROPOFOL: SHX5780

## 2019-09-02 LAB — CBC
HCT: 36.2 % (ref 36.0–46.0)
Hemoglobin: 11.8 g/dL — ABNORMAL LOW (ref 12.0–15.0)
MCH: 29 pg (ref 26.0–34.0)
MCHC: 32.6 g/dL (ref 30.0–36.0)
MCV: 88.9 fL (ref 80.0–100.0)
Platelets: 172 10*3/uL (ref 150–400)
RBC: 4.07 MIL/uL (ref 3.87–5.11)
RDW: 14.2 % (ref 11.5–15.5)
WBC: 19.3 10*3/uL — ABNORMAL HIGH (ref 4.0–10.5)
nRBC: 0 % (ref 0.0–0.2)

## 2019-09-02 LAB — BASIC METABOLIC PANEL
Anion gap: 8 (ref 5–15)
BUN: 15 mg/dL (ref 8–23)
CO2: 26 mmol/L (ref 22–32)
Calcium: 7.9 mg/dL — ABNORMAL LOW (ref 8.9–10.3)
Chloride: 108 mmol/L (ref 98–111)
Creatinine, Ser: 0.78 mg/dL (ref 0.44–1.00)
GFR calc Af Amer: >60 mL/min (ref >60)
GFR calc non Af Amer: >60 mL/min (ref >60)
Glucose, Bld: 111 mg/dL — ABNORMAL HIGH (ref 70–99)
Potassium: 3.3 mmol/L — ABNORMAL LOW (ref 3.5–5.1)
Sodium: 142 mmol/L (ref 135–145)

## 2019-09-02 LAB — C-REACTIVE PROTEIN: CRP: 11.6 mg/dL — ABNORMAL HIGH (ref <1.0)

## 2019-09-02 LAB — MAGNESIUM: Magnesium: 2.4 mg/dL (ref 1.7–2.4)

## 2019-09-02 SURGERY — COLONOSCOPY WITH PROPOFOL
Anesthesia: General

## 2019-09-02 MED ORDER — PROPOFOL 500 MG/50ML IV EMUL
INTRAVENOUS | Status: DC | PRN
  Administered 2019-09-02: 120 ug/kg/min via INTRAVENOUS

## 2019-09-02 MED ORDER — PROPOFOL 10 MG/ML IV BOLUS
INTRAVENOUS | Status: DC | PRN
  Administered 2019-09-02: 50 mg via INTRAVENOUS

## 2019-09-02 MED ORDER — SODIUM CHLORIDE 0.9 % IV SOLN: INTRAVENOUS | Status: DC

## 2019-09-02 MED ORDER — LIDOCAINE 2% (20 MG/ML) 5 ML SYRINGE
INTRAMUSCULAR | Status: DC | PRN
  Administered 2019-09-02: 25 mg via INTRAVENOUS

## 2019-09-02 MED ORDER — POTASSIUM CHLORIDE CRYS ER 20 MEQ PO TBCR
40.0000 meq | EXTENDED_RELEASE_TABLET | Freq: Once | ORAL | Status: AC
  Administered 2019-09-02: 16:00:00 40 meq via ORAL
  Filled 2019-09-02: qty 2

## 2019-09-02 MED ORDER — SALINE SPRAY 0.65 % NA SOLN
1.0000 | NASAL | Status: DC | PRN
  Filled 2019-09-02 (×2): qty 44

## 2019-09-02 MED ORDER — POTASSIUM CHLORIDE 10 MEQ/100ML IV SOLN
10.0000 meq | INTRAVENOUS | Status: DC
  Administered 2019-09-02 (×2): 10 meq via INTRAVENOUS
  Filled 2019-09-02: qty 100

## 2019-09-02 MED ORDER — HYDROCHLOROTHIAZIDE 12.5 MG PO CAPS
12.5000 mg | ORAL_CAPSULE | Freq: Every day | ORAL | Status: DC
  Administered 2019-09-02 – 2019-09-03 (×2): 12.5 mg via ORAL
  Filled 2019-09-02 (×3): qty 1

## 2019-09-02 NOTE — Anesthesia Preprocedure Evaluation (Signed)
Anesthesia Evaluation  Patient identified by MRN, date of birth, ID band Patient awake    Reviewed: Allergy & Precautions, NPO status , Patient's Chart, lab work & pertinent test results, reviewed documented beta blocker date and time   History of Anesthesia Complications Negative for: history of anesthetic complications  Airway Mallampati: I  TM Distance: >3 FB     Dental  (+) Dental Advidsory Given, Teeth Intact, Caps Permanent bridge on top left and bottom right:   Pulmonary neg pulmonary ROS, former smoker,    Pulmonary exam normal        Cardiovascular Exercise Tolerance: Good hypertension, Pt. on medications and Pt. on home beta blockers (-) angina(-) Past MI and (-) Cardiac Stents Normal cardiovascular exam+ dysrhythmias + Valvular Problems/Murmurs      Neuro/Psych  Headaches, neg Seizures    GI/Hepatic Neg liver ROS, GERD  ,  Endo/Other  negative endocrine ROS  Renal/GU negative Renal ROS     Musculoskeletal  (+) Arthritis ,   Abdominal   Peds  Hematology   Anesthesia Other Findings Past Medical History: No date: Cancer ([place])     Comment:  skin No date: GERD (gastroesophageal reflux disease) No date: Hyperlipidemia No date: Hypertension No date: Tendinitis   Reproductive/Obstetrics negative OB ROS                             Anesthesia Physical  Anesthesia Plan  ASA: III  Anesthesia Plan: General   Post-op Pain Management:    Induction: Intravenous  PONV Risk Score and Plan: 2 and Propofol infusion and TIVA  Airway Management Planned: Nasal Cannula and Natural Airway  Additional Equipment:   Intra-op Plan:   Post-operative Plan:   Informed Consent: I have reviewed the patients History and Physical, chart, labs and discussed the procedure including the risks, benefits and alternatives for the proposed anesthesia with the patient or authorized representative who  has indicated his/her understanding and acceptance.       Plan Discussed with: CRNA  Anesthesia Plan Comments:         Anesthesia Quick Evaluation

## 2019-09-02 NOTE — Progress Notes (Signed)
PROGRESS NOTE    KELTY SZAFRAN  ALP:[id number] DOB: 1945/06/02 DOA: 08/31/2019 PCP: Idelle Crouch, MD   Brief Narrative:   Kerry Jefferson is a 74 y.o. female with medical history significant for recurrent diverticulitis, history of C. difficile, hypertension hyperlipidemia who presents with concerns of worsening left lower quadrant pain.  About 30 minutes following eating lunch today patient noted worsening lower abdominal cramping.  She also had an episode of vomiting.  Also had 2-3 episodes of diarrhea today and noted bright red blood per rectum.  She does havehemorrhoids but feels that this is more than her usual bleeding that she gets with her hemorrhoids.  She has had 3 episodes of diverticulitis in the past 6 months and recently finished a course of Augmentin a few days ago.  She is followed by GI outpatient and they have suggested a potential sigmoid colectomy in the future if her symptoms persist. CT abdomen with inflammatory changes involving the splenic flexure, descending colon and proximal sigmoid colon, most suggestive of colitis.  Subjective: Continues to experience left lower quadrant pain.  No nausea or vomiting.  She was waiting for her colonoscopy.  Having clear BMs.  Assessment & Plan:   Principal Problem:   Colitis Active Problems:   Cyst of right ovary   HLD (hyperlipidemia)   Essential (primary) hypertension   AKI (acute kidney injury) (HCC)   Hypokalemia  Colitis with history of recurrent diverticulitis Surprisingly patient's diverticulitis has resolved but now has colitis higher in the splenic flexure in descending colon mild sigmoid colon.  Continue to have tenderness in left lower quadrant.  No nausea, vomiting or diarrhea since hospitalization.  C. difficile and GI pathogen panel was negative. -Patient had her colonoscopy today which shows severe inflammation, erythema and ulceration involving colon from splenic flexure till proximal sigmoid.  Biopsies  were taken-pending results. -Continue Rocephin and Flagyl. -Start her on clear liquid diet.  AKI.  Resolved. -Monitor renal functions.  Hypokalemia.  K at 3.3 with magnesium of 2.4 today.  Most likely secondary to GI losses -Monitor electrolytes, replete as needed.  Hypertension.  Blood pressure remained elevated today. Home dose of olmesartan and HCTZ was held due to AKI. - home dose of olmesartan was started yesterday. -Restart home dose of HCTZ.  Hyperlipidemia. -Continue statin  Objective: Vitals:   09/02/19 1140 09/02/19 1323 09/02/19 1343 09/02/19 1353  BP: (!) 158/67  (!) 160/67 (!) 160/66  Pulse: 67  71   Resp: 16  16   Temp: 98.5 F (36.9 C) (!) 97.5 F (36.4 C)    TempSrc: Oral Temporal    SpO2: 94%     Weight:      Height:        Intake/Output Summary (Last 24 hours) at 09/02/2019 1440 Last data filed at 09/02/2019 1256 Gross per 24 hour  Intake 430.38 ml  Output 1000 ml  Net -569.62 ml   Filed Weights   08/31/19 1700  Weight: 84.8 kg    Examination:  General exam: Appears calm and comfortable  Respiratory system: Clear to auscultation. Respiratory effort normal. Cardiovascular system: S1 & S2 heard, RRR. No JVD, murmurs, rubs, gallops or clicks. Gastrointestinal system: Soft, left lower quadrant tenderness, nondistended, bowel sounds positive. Central nervous system: Alert and oriented. No focal neurological deficits.Symmetric 5 x 5 power. Extremities: No edema, no cyanosis, pulses intact and symmetrical. Psychiatry: Judgement and insight appear normal. Mood & affect appropriate.    DVT prophylaxis: Lovenox Code Status: Full Family  Communication: Husband was updated at bedside Disposition Plan:  Status is: Inpatient  Remains inpatient appropriate because:Inpatient level of care appropriate due to severity of illness   Dispo: The patient is from: Home              Anticipated d/c is to: Home              Anticipated d/c date is: 2 days               Patient currently is not medically stable to d/c.   Consultants:   GI  Procedures:  Antimicrobials:  Rocephin Flagyl  Data Reviewed: I have personally reviewed following labs and imaging studies  CBC: Recent Labs  Lab 08/31/19 1713 09/01/19 0527 09/02/19 0538  WBC 20.0* 16.6* 19.3*  HGB 13.9 12.3 11.8*  HCT 42.0 36.7 36.2  MCV 87.7 87.2 88.9  PLT 193 169 950   Basic Metabolic Panel: Recent Labs  Lab 08/31/19 1713 09/01/19 0527 09/02/19 0538  NA 138 139 142  K 3.4* 3.6 3.3*  CL 101 104 108  CO2 27 27 26   GLUCOSE 118* 116* 111*  BUN 24* 22 15  CREATININE 1.06* 0.84 0.78  CALCIUM 9.0 7.7* 7.9*  MG  --   --  2.4   GFR: Estimated Creatinine Clearance: 65.3 mL/min (by C-G formula based on SCr of 0.78 mg/dL). Liver Function Tests: Recent Labs  Lab 08/31/19 1713  AST 24  ALT 20  ALKPHOS 62  BILITOT 0.7  PROT 7.0  ALBUMIN 4.2   Recent Labs  Lab 08/31/19 1713  LIPASE 45   No results for input(s): AMMONIA in the last 168 hours. Coagulation Profile: No results for input(s): INR, PROTIME in the last 168 hours. Cardiac Enzymes: No results for input(s): CKTOTAL, CKMB, CKMBINDEX, TROPONINI in the last 168 hours. BNP (last 3 results) No results for input(s): PROBNP in the last 8760 hours. HbA1C: No results for input(s): HGBA1C in the last 72 hours. CBG: No results for input(s): GLUCAP in the last 168 hours. Lipid Profile: No results for input(s): CHOL, HDL, LDLCALC, TRIG, CHOLHDL, LDLDIRECT in the last 72 hours. Thyroid Function Tests: No results for input(s): TSH, T4TOTAL, FREET4, T3FREE, THYROIDAB in the last 72 hours. Anemia Panel: No results for input(s): VITAMINB12, FOLATE, FERRITIN, TIBC, IRON, RETICCTPCT in the last 72 hours. Sepsis Labs: Recent Labs  Lab 08/31/19 2202  LATICACIDVEN 1.2    Recent Results (from the past 240 hour(s))  Blood culture (routine x 2)     Status: None (Preliminary result)   Collection Time: 08/31/19 10:02 PM    Specimen: BLOOD  Result Value Ref Range Status   Specimen Description BLOOD RIGHT [facility]  Final   Special Requests   Final    BOTTLES DRAWN AEROBIC AND ANAEROBIC Blood Culture adequate volume   Culture   Final    NO GROWTH 2 DAYS Performed at [facility], [address] [place], [place] [address]    Report Status PENDING  Incomplete  Blood culture (routine x 2)     Status: None (Preliminary result)   Collection Time: 08/31/19 10:24 PM   Specimen: BLOOD  Result Value Ref Range Status   Specimen Description BLOOD RIGHT ARM  Final   Special Requests   Final    BOTTLES DRAWN AEROBIC AND ANAEROBIC Blood Culture adequate volume   Culture   Final    NO GROWTH 2 DAYS Performed at [facility], [address] [place], [place] [address]  Report Status PENDING  Incomplete  Gastrointestinal Panel by PCR , Stool     Status: None   Collection Time: 09/01/19 12:25 AM   Specimen: Nasopharyngeal Swab; Stool  Result Value Ref Range Status   Campylobacter species NOT DETECTED NOT DETECTED Final   Plesimonas shigelloides NOT DETECTED NOT DETECTED Final   Salmonella species NOT DETECTED NOT DETECTED Final   Yersinia enterocolitica NOT DETECTED NOT DETECTED Final   Vibrio species NOT DETECTED NOT DETECTED Final   Vibrio cholerae NOT DETECTED NOT DETECTED Final   Enteroaggregative E coli (EAEC) NOT DETECTED NOT DETECTED Final   Enteropathogenic E coli (EPEC) NOT DETECTED NOT DETECTED Final   Enterotoxigenic E coli (ETEC) NOT DETECTED NOT DETECTED Final   Shiga like toxin producing E coli (STEC) NOT DETECTED NOT DETECTED Final   Shigella/Enteroinvasive E coli (EIEC) NOT DETECTED NOT DETECTED Final   Cryptosporidium NOT DETECTED NOT DETECTED Final   Cyclospora cayetanensis NOT DETECTED NOT DETECTED Final   Entamoeba histolytica NOT DETECTED NOT DETECTED Final   Giardia lamblia NOT DETECTED NOT DETECTED Final   Adenovirus F40/41 NOT DETECTED NOT DETECTED Final   Astrovirus NOT  DETECTED NOT DETECTED Final   Norovirus GI/GII NOT DETECTED NOT DETECTED Final   Rotavirus A NOT DETECTED NOT DETECTED Final   Sapovirus (I, II, IV, and V) NOT DETECTED NOT DETECTED Final    Comment: Performed at [facility], [address] [place], [place] [address]  C Difficile Quick Screen w PCR reflex     Status: None   Collection Time: 09/01/19 12:25 AM   Specimen: Nasopharyngeal Swab; Stool  Result Value Ref Range Status   C Diff antigen NEGATIVE NEGATIVE Final   C Diff toxin NEGATIVE NEGATIVE Final   C Diff interpretation No C. difficile detected.  Final    Comment: Performed at [facility], [place], [place] [address]  SARS CORONAVIRUS 2 (TAT 6-24 HRS) Nasopharyngeal Nasopharyngeal Swab     Status: None   Collection Time: 09/01/19 12:25 AM   Specimen: Nasopharyngeal Swab  Result Value Ref Range Status   SARS Coronavirus 2 NEGATIVE NEGATIVE Final    Comment: (NOTE) SARS-CoV-2 target nucleic acids are NOT DETECTED. The SARS-CoV-2 RNA is generally detectable in upper and lower respiratory specimens during the acute phase of infection. Negative results do not preclude SARS-CoV-2 infection, do not rule out co-infections with other pathogens, and should not be used as the sole basis for treatment or other patient management decisions. Negative results must be combined with clinical observations, patient history, and epidemiological information. The expected result is Negative. Fact Sheet for Patients: SugarRoll.be Fact Sheet for Healthcare Providers: https://www.woods-mathews.com/ This test is not yet approved or cleared by the [place] FDA and  has been authorized for detection and/or diagnosis of SARS-CoV-2 by FDA under an Emergency Use Authorization (EUA). This EUA will remain  in effect (meaning this test can be used) for the duration of the COVID-19 declaration under Section 56 4(b)(1) of the  Act, 21 U.S.C. section 360bbb-3(b)(1), unless the authorization is terminated or revoked sooner. Performed at [facility], [place] [address], [place], [place] [address]      Radiology Studies: CT ABDOMEN PELVIS W CONTRAST  Result Date: 08/31/2019 CLINICAL DATA:  History of diverticulitis, abdominal cramping with bleeding and vomiting EXAM: CT ABDOMEN AND PELVIS WITH CONTRAST TECHNIQUE: Multidetector CT imaging of the abdomen and pelvis was performed using the standard protocol following bolus administration of intravenous contrast. CONTRAST:  174m OMNIPAQUE IOHEXOL 300 MG/ML  SOLN COMPARISON:  CT 04/03/2019 FINDINGS: Lower chest: Lung bases demonstrate no acute consolidation or pleural effusion. Heart size upper limits of normal. Hepatobiliary: No focal liver abnormality is seen. Status post cholecystectomy. No biliary dilatation. Pancreas: Unremarkable. No pancreatic ductal dilatation or surrounding inflammatory changes. Spleen: Normal in size without focal abnormality. Adrenals/Urinary Tract: Subcentimeter hypodensity in the lower pole right kidney. Adrenal glands are normal. No hydronephrosis. The bladder is normal Stomach/Bowel: Stomach is nonenlarged. No dilated small bowel. Negative appendix. The previously noted inflammatory changes at the sigmoid colon have improved. Now seen are new inflammatory changes involving the splenic flexure, descending colon and proximal sigmoid colon. No extraluminal gas. Vascular/Lymphatic: Mild aortic atherosclerosis. No aneurysm. No suspicious adenopathy Reproductive: Status post hysterectomy. 3.2 cm right adnexal cyst, unchanged since 04/03/2019. Other: Negative for free air or free fluid. Musculoskeletal: No acute or suspicious osseous abnormality IMPRESSION: 1. Previously noted inflammatory changes at the mid to distal sigmoid colon have improved. There is however interim development of wall thickening and inflammatory change involving the splenic flexure,  descending colon and proximal sigmoid colon. Although there are diverticula present at the proximal sigmoid colon, given length of involvement, suspect that the findings are related to colitis of infectious, inflammatory, or ischemic etiology. There is no evidence for a bowel obstruction. Negative for perforation or abscess. 2. 3.2 cm right adnexal cyst. Recommend follow-up pelvic ultrasound. This may be performed on a nonemergent basis. Electronically Signed   By: KDonavan FoilM.D.   On: 08/31/2019 22:48    Scheduled Meds: . [MAR Hold] atorvastatin  40 mg Oral q1800  . [MAR Hold] cholecalciferol  2,000 Units Oral Daily  . [MAR Hold] enoxaparin (LOVENOX) injection  40 mg Subcutaneous Q24H  . [MAR Hold] escitalopram  10 mg Oral Daily  . [MAR Hold] folic acid  1 mg Oral Daily  . [MAR Hold] irbesartan  300 mg Oral Daily  . [MAR Hold] metoprolol succinate  100 mg Oral Daily  . [MAR Hold] pantoprazole  40 mg Oral Daily  . potassium chloride  40 mEq Oral Once  . [MAR Hold] sodium chloride flush  3 mL Intravenous Once   Continuous Infusions: . [MAR Hold] sodium chloride 30 mL (09/02/19 1218)  . sodium chloride 75 mL/hr at 09/02/19 1241  . sodium chloride    . [MAR Hold] cefTRIAXone (ROCEPHIN)  IV Stopped (09/02/19 0124)  . [MAR Hold] metronidazole 500 mg (09/02/19 1012)     LOS: 2 days   Time spent: 40 minutes.  SLorella Nimrod MD [facility]  If 7PM-7AM, please contact night-coverage [email or web address]  09/02/2019, 2:40 PM   This record has been created using DSystem[employer] analyst Errors have been sought and corrected,but may not always be located. Such creation errors do not reflect on the standard of care.

## 2019-09-02 NOTE — Op Note (Signed)
[facility] Gastroenterology Patient Name: Kerry Jefferson Procedure Date: 09/02/2019 12:38 PM MRN: [id number] Account #: [id number] Date of Birth: 06-Dec-1945 Admit Type: Outpatient Age: 74 Room: [facility] ENDO ROOM 1 Gender: Female Note Status: Finalized Procedure:             Colonoscopy Indications:           Last colonoscopy: June 2017, Abdominal pain in the                         left lower quadrant, Lower abdominal pain,                         Hematochezia, Abnormal CT of the GI tract Providers:             Lin Landsman MD, MD Medicines:             Monitored Anesthesia Care Complications:         No immediate complications. Estimated blood loss:                         Minimal. Procedure:             Pre-Anesthesia Assessment:                        - Prior to the procedure, a History and Physical was                         performed, and patient medications and allergies were                         reviewed. The patient is competent. The risks and                         benefits of the procedure and the sedation options and                         risks were discussed with the patient. All questions                         were answered and informed consent was obtained.                         Patient identification and proposed procedure were                         verified by the physician, the nurse, the                         anesthesiologist, the anesthetist and the technician                         in the pre-procedure area in the procedure room in the                         endoscopy suite. Mental Status Examination: alert and                         oriented. Airway Examination: normal oropharyngeal  airway and neck mobility. Respiratory Examination:                         clear to auscultation. CV Examination: normal.                         Prophylactic Antibiotics: The patient does not require       prophylactic antibiotics. Prior Anticoagulants: The                         patient has taken no previous anticoagulant or                         antiplatelet agents. ASA Grade Assessment: III - A                         patient with severe systemic disease. After reviewing                         the risks and benefits, the patient was deemed in                         satisfactory condition to undergo the procedure. The                         anesthesia plan was to use monitored anesthesia care                         (MAC). Immediately prior to administration of                         medications, the patient was re-assessed for adequacy                         to receive sedatives. The heart rate, respiratory                         rate, oxygen saturations, blood pressure, adequacy of                         pulmonary ventilation, and response to care were                         monitored throughout the procedure. The physical                         status of the patient was re-assessed after the                         procedure.                        After obtaining informed consent, the colonoscope was                         passed under direct vision. Throughout the procedure,                         the patient's blood pressure, pulse, and  oxygen                         saturations were monitored continuously. The                         Colonoscope was introduced through the anus and                         advanced to the the terminal ileum, with                         identification of the appendiceal orifice and IC                         valve. The colonoscopy was performed with moderate                         difficulty due to multiple diverticula in the colon                         and significant looping. Successful completion of the                         procedure was aided by applying abdominal pressure.                         The patient tolerated the  procedure well. The quality                         of the bowel preparation was adequate. Findings:      The perianal and digital rectal examinations were normal. Pertinent       negatives include normal sphincter tone and no palpable rectal lesions.      The terminal ileum appeared normal.      Multiple diverticula were found in the entire colon, worse in left colon.      Diffuse severe inflammation characterized by congestion (edema),       erythema, friability, loss of vascularity, confluent ulcerations and       shallow ulcerations was found from sigmoid to splenic flexure, from 30       to 80 cm proximal to the anus. Biopsies were taken with a cold forceps       for histology.      There was no inflammation in distal and mid sigmoid surrounding       diverticula      Localized mild inflammation characterized by congestion (edema),       erythema and friability was found in the rectum. Biopsies were taken       with a cold forceps for histology.      The retroflexed view of the distal rectum and anal verge was normal and       showed no anal or rectal abnormalities. Impression:            - The examined portion of the ileum was normal.                        - Diverticulosis in the entire examined colon.                        -  Diffuse severe inflammation was found from sigmoid                         to splenic flexure and from 30 to 80 cm proximal to                         the anus secondary to left-sided colitis. Biopsied.                        - Localized mild inflammation was found in the rectum                         secondary to proctitis. Biopsied.                        - The distal rectum and anal verge are normal on                         retroflexion view. Recommendation:        - Return patient to hospital ward for ongoing care.                        - Advance diet as tolerated today.                        - Continue present medications.                         - Await pathology results to r/o IBD. Procedure Code(s):     --- Professional ---                        [phone], Colonoscopy, flexible; with biopsy, single or                         multiple Diagnosis Code(s):     --- Professional ---                        K51.50, Left sided colitis without complications                        K62.89, Other specified diseases of anus and rectum                        R10.32, Left lower quadrant pain                        R10.30, Lower abdominal pain, unspecified                        K92.1, Melena (includes Hematochezia)                        K57.30, Diverticulosis of large intestine without                         perforation or abscess without bleeding                        R93.3, Abnormal findings on diagnostic imaging of  other parts of digestive tract CPT copyright 2019 American Medical Association. All rights reserved. The codes documented in this report are preliminary and upon coder review may  be revised to meet current compliance requirements. Dr. Ulyess Mort Lin Landsman MD, MD 09/02/2019 1:23:44 PM This report has been signed electronically. Number of Addenda: 0 Note Initiated On: 09/02/2019 12:38 PM Scope Withdrawal Time: 0 hours 13 minutes 41 seconds  Total Procedure Duration: 0 hours 25 minutes 6 seconds  Estimated Blood Loss:  Estimated blood loss was minimal.      [facility]

## 2019-09-02 NOTE — Transfer of Care (Signed)
Immediate Anesthesia Transfer of Care Note  Patient: Kerry Jefferson  Procedure(s) Performed: COLONOSCOPY WITH PROPOFOL (N/A )  Patient Location: PACU  Anesthesia Type:General  Level of Consciousness: awake and sedated  Airway & Oxygen Therapy: Patient Spontanous Breathing and Patient connected to nasal cannula oxygen  Post-op Assessment: Report given to RN  Post vital signs: Reviewed and stable  Last Vitals:  Vitals Value Taken Time  BP    Temp    Pulse    Resp    SpO2      Last Pain:  Vitals:   09/02/19 1209  TempSrc:   PainSc: 0-No pain         Complications: No apparent anesthesia complications

## 2019-09-02 NOTE — Anesthesia Postprocedure Evaluation (Signed)
Anesthesia Post Note  Patient: Kerry Jefferson  Procedure(s) Performed: COLONOSCOPY WITH PROPOFOL (N/A )  Patient location during evaluation: Endoscopy Anesthesia Type: General Level of consciousness: awake and alert Pain management: pain level controlled Vital Signs Assessment: post-procedure vital signs reviewed and stable Respiratory status: spontaneous breathing, nonlabored ventilation, respiratory function stable and patient connected to nasal cannula oxygen Cardiovascular status: blood pressure returned to baseline and stable Postop Assessment: no apparent nausea or vomiting Anesthetic complications: no     Last Vitals:  Vitals:   09/02/19 1343 09/02/19 1353  BP: (!) 160/67 (!) 160/66  Pulse: 71   Resp: 16   Temp:    SpO2:      Last Pain:  Vitals:   09/02/19 1353  TempSrc:   PainSc: 0-No pain                 Martha Clan

## 2019-09-03 ENCOUNTER — Encounter: Payer: Self-pay | Admitting: *Deleted

## 2019-09-03 DIAGNOSIS — K921 Melena: Secondary | ICD-10-CM | POA: Diagnosis not present

## 2019-09-03 LAB — BASIC METABOLIC PANEL
Anion gap: 9 (ref 5–15)
BUN: 12 mg/dL (ref 8–23)
CO2: 25 mmol/L (ref 22–32)
Calcium: 7.9 mg/dL — ABNORMAL LOW (ref 8.9–10.3)
Chloride: 106 mmol/L (ref 98–111)
Creatinine, Ser: 0.73 mg/dL (ref 0.44–1.00)
GFR calc Af Amer: >60 mL/min (ref >60)
GFR calc non Af Amer: >60 mL/min (ref >60)
Glucose, Bld: 89 mg/dL (ref 70–99)
Potassium: 3.5 mmol/L (ref 3.5–5.1)
Sodium: 140 mmol/L (ref 135–145)

## 2019-09-03 LAB — CBC
HCT: 34.1 % — ABNORMAL LOW (ref 36.0–46.0)
Hemoglobin: 11.1 g/dL — ABNORMAL LOW (ref 12.0–15.0)
MCH: 28.8 pg (ref 26.0–34.0)
MCHC: 32.6 g/dL (ref 30.0–36.0)
MCV: 88.3 fL (ref 80.0–100.0)
Platelets: 166 10*3/uL (ref 150–400)
RBC: 3.86 MIL/uL — ABNORMAL LOW (ref 3.87–5.11)
RDW: 14.2 % (ref 11.5–15.5)
WBC: 14 10*3/uL — ABNORMAL HIGH (ref 4.0–10.5)
nRBC: 0 % (ref 0.0–0.2)

## 2019-09-03 MED ORDER — PREDNISONE 20 MG PO TABS
40.0000 mg | ORAL_TABLET | Freq: Every day | ORAL | Status: DC
  Administered 2019-09-04: 08:00:00 40 mg via ORAL
  Filled 2019-09-03: qty 2

## 2019-09-03 MED ORDER — HYDROCHLOROTHIAZIDE 12.5 MG PO CAPS
37.5000 mg | ORAL_CAPSULE | Freq: Once | ORAL | Status: AC
  Administered 2019-09-03: 15:00:00 37.5 mg via ORAL
  Filled 2019-09-03: qty 3

## 2019-09-03 MED ORDER — HYDRALAZINE HCL 50 MG PO TABS
50.0000 mg | ORAL_TABLET | Freq: Four times a day (QID) | ORAL | Status: DC | PRN
  Administered 2019-09-03: 15:00:00 50 mg via ORAL
  Filled 2019-09-03: qty 1

## 2019-09-03 MED ORDER — HYDROCHLOROTHIAZIDE 25 MG PO TABS
50.0000 mg | ORAL_TABLET | Freq: Every day | ORAL | Status: DC
  Administered 2019-09-04: 50 mg via ORAL
  Filled 2019-09-03: qty 2

## 2019-09-03 MED ORDER — ACETAMINOPHEN 325 MG PO TABS
650.0000 mg | ORAL_TABLET | Freq: Four times a day (QID) | ORAL | Status: DC | PRN
  Administered 2019-09-03 – 2019-09-04 (×2): 650 mg via ORAL
  Filled 2019-09-03 (×2): qty 2

## 2019-09-03 NOTE — Care Management Important Message (Signed)
Important Message  Patient Details  Name: MYRNA VONSEGGERN MRN: [id number] Date of Birth: [birth year]   Medicare Important Message Given:  Yes     Dannette Barbara 09/03/2019, 11:10 AM

## 2019-09-03 NOTE — Progress Notes (Signed)
Kerry Darby, MD [address]  [place]  [place], [place] [address]  Main: [phone]  Fax: [phone] Pager: [phone]   Subjective: Patient underwent colonoscopy yesterday, revealed severe left-sided colitis.  She reports having bloody bowel movements every time she urinates.  She is able to tolerate full liquids.  Her WBC count is improving.  She said her blood pressure was very high this afternoon and then received a dose of hydralazine followed by ringingness/discomfort in her right ear.  Patient is sitting up in chair, her daughter is bedside   Objective: Vital signs in last 24 hours: Vitals:   09/03/19 0749 09/03/19 1221 09/03/19 1519 09/03/19 1623  BP: (!) 177/81 (!) 177/74 (!) 128/59 121/60  Pulse: 62 61 77 75  Resp: 16 16 16 16   Temp: 98.6 F (37 C) 98.5 F (36.9 C)  98.2 F (36.8 C)  TempSrc: Oral Oral  Oral  SpO2: 96% 97% 97% 95%  Weight:      Height:       Weight change:   Intake/Output Summary (Last 24 hours) at 09/03/2019 1850 Last data filed at 09/03/2019 1523 Gross per 24 hour  Intake 300 ml  Output -  Net 300 ml     Exam: Heart:: Regular rate and rhythm, S1S2 present or without murmur or extra heart sounds Lungs: normal and clear to auscultation Abdomen: soft, nontender, normal bowel sounds   Lab Results: CBC Latest Ref Rng & Units 09/03/2019 09/02/2019 09/01/2019  WBC 4.0 - 10.5 K/uL 14.0(H) 19.3(H) 16.6(H)  Hemoglobin 12.0 - 15.0 g/dL 11.1(L) 11.8(L) 12.3  Hematocrit 36.0 - 46.0 % 34.1(L) 36.2 36.7  Platelets 150 - 400 K/uL 166 172 169   CMP Latest Ref Rng & Units 09/03/2019 09/02/2019 09/01/2019  Glucose 70 - 99 mg/dL 89 111(H) 116(H)  BUN 8 - 23 mg/dL 12 15 22   Creatinine 0.44 - 1.00 mg/dL 0.73 0.78 0.84  Sodium 135 - 145 mmol/L 140 142 139  Potassium 3.5 - 5.1 mmol/L 3.5 3.3(L) 3.6  Chloride 98 - 111 mmol/L 106 108 104  CO2 22 - 32 mmol/L 25 26 27   Calcium 8.9 - 10.3 mg/dL 7.9(L) 7.9(L) 7.7(L)  Total Protein 6.5 - 8.1  g/dL - - -  Total Bilirubin 0.3 - 1.2 mg/dL - - -  Alkaline Phos 38 - 126 U/L - - -  AST 15 - 41 U/L - - -  ALT 0 - 44 U/L - - -    Micro Results: Recent Results (from the past 240 hour(s))  Blood culture (routine x 2)     Status: None (Preliminary result)   Collection Time: 08/31/19 10:02 PM   Specimen: BLOOD  Result Value Ref Range Status   Specimen Description BLOOD RIGHT [facility]  Final   Special Requests   Final    BOTTLES DRAWN AEROBIC AND ANAEROBIC Blood Culture adequate volume   Culture   Final    NO GROWTH 3 DAYS Performed at [facility], [address] [place], [place] [address]    Report Status PENDING  Incomplete  Blood culture (routine x 2)     Status: None (Preliminary result)   Collection Time: 08/31/19 10:24 PM   Specimen: BLOOD  Result Value Ref Range Status   Specimen Description BLOOD RIGHT ARM  Final   Special Requests   Final    BOTTLES DRAWN AEROBIC AND ANAEROBIC Blood Culture adequate volume   Culture   Final    NO GROWTH 3 DAYS  Performed at [facility], [address] [place], [place] [address]    Report Status PENDING  Incomplete  Gastrointestinal Panel by PCR , Stool     Status: None   Collection Time: 09/01/19 12:25 AM   Specimen: Nasopharyngeal Swab; Stool  Result Value Ref Range Status   Campylobacter species NOT DETECTED NOT DETECTED Final   Plesimonas shigelloides NOT DETECTED NOT DETECTED Final   Salmonella species NOT DETECTED NOT DETECTED Final   Yersinia enterocolitica NOT DETECTED NOT DETECTED Final   Vibrio species NOT DETECTED NOT DETECTED Final   Vibrio cholerae NOT DETECTED NOT DETECTED Final   Enteroaggregative E coli (EAEC) NOT DETECTED NOT DETECTED Final   Enteropathogenic E coli (EPEC) NOT DETECTED NOT DETECTED Final   Enterotoxigenic E coli (ETEC) NOT DETECTED NOT DETECTED Final   Shiga like toxin producing E coli (STEC) NOT DETECTED NOT DETECTED Final   Shigella/Enteroinvasive E coli (EIEC) NOT DETECTED NOT  DETECTED Final   Cryptosporidium NOT DETECTED NOT DETECTED Final   Cyclospora cayetanensis NOT DETECTED NOT DETECTED Final   Entamoeba histolytica NOT DETECTED NOT DETECTED Final   Giardia lamblia NOT DETECTED NOT DETECTED Final   Adenovirus F40/41 NOT DETECTED NOT DETECTED Final   Astrovirus NOT DETECTED NOT DETECTED Final   Norovirus GI/GII NOT DETECTED NOT DETECTED Final   Rotavirus A NOT DETECTED NOT DETECTED Final   Sapovirus (I, II, IV, and V) NOT DETECTED NOT DETECTED Final    Comment: Performed at [facility], [address] [place], [place] [address]  C Difficile Quick Screen w PCR reflex     Status: None   Collection Time: 09/01/19 12:25 AM   Specimen: Nasopharyngeal Swab; Stool  Result Value Ref Range Status   C Diff antigen NEGATIVE NEGATIVE Final   C Diff toxin NEGATIVE NEGATIVE Final   C Diff interpretation No C. difficile detected.  Final    Comment: Performed at [facility], [place], [place] [address]  SARS CORONAVIRUS 2 (TAT 6-24 HRS) Nasopharyngeal Nasopharyngeal Swab     Status: None   Collection Time: 09/01/19 12:25 AM   Specimen: Nasopharyngeal Swab  Result Value Ref Range Status   SARS Coronavirus 2 NEGATIVE NEGATIVE Final    Comment: (NOTE) SARS-CoV-2 target nucleic acids are NOT DETECTED. The SARS-CoV-2 RNA is generally detectable in upper and lower respiratory specimens during the acute phase of infection. Negative results do not preclude SARS-CoV-2 infection, do not rule out co-infections with other pathogens, and should not be used as the sole basis for treatment or other patient management decisions. Negative results must be combined with clinical observations, patient history, and epidemiological information. The expected result is Negative. Fact Sheet for Patients: SugarRoll.be Fact Sheet for Healthcare Providers: https://www.woods-mathews.com/ This test is not yet  approved or cleared by the [place] FDA and  has been authorized for detection and/or diagnosis of SARS-CoV-2 by FDA under an Emergency Use Authorization (EUA). This EUA will remain  in effect (meaning this test can be used) for the duration of the COVID-19 declaration under Section 56 4(b)(1) of the Act, 21 U.S.C. section 360bbb-3(b)(1), unless the authorization is terminated or revoked sooner. Performed at [facility], [place] [address] [place], [place] [address]    Studies/Results: No results found. Medications:  I have reviewed the patient's current medications. Prior to Admission:  Medications Prior to Admission  Medication Sig Dispense Refill Last Dose  . cholecalciferol (VITAMIN D) 25 MCG (1000 UNIT) tablet Take 2,000  Units by mouth daily.   08/30/2019 at 1000  . Coenzyme Q10 (COQ-10 PO) Take 1 tablet by mouth daily.   08/30/2019 at Unknown time  . desonide (DESOWEN) 0.05 % cream    prn at prn  . escitalopram (LEXAPRO) 10 MG tablet Take 1 tablet by mouth daily.   08/30/2019 at Unknown time  . esomeprazole (NEXIUM) 20 MG capsule Take 20 mg by mouth as needed.    prn at prn  . fluticasone (FLONASE) 50 MCG/ACT nasal spray USE TWO SPRAY(S) IN EACH NOSTRIL ONCE DAILY.   prn at prn  . folic acid (FOLVITE) 229 MCG tablet Take by mouth.   08/30/2019 at Unknown time  . ketoconazole (NIZORAL) 2 % cream    prn at prn  . olmesartan-hydrochlorothiazide (BENICAR HCT) 40-12.5 MG tablet Take 1 tablet by mouth daily.   08/31/2019 at Unknown time  . simvastatin (ZOCOR) 80 MG tablet Take 40 mg by mouth daily at 6 PM.    08/30/2019 at Unknown time  . TOPROL XL 100 MG 24 hr tablet Take 100 mg by mouth daily.    08/30/2019 at Unknown time  . traZODone (DESYREL) 50 MG tablet Take 50 mg by mouth at bedtime as needed.    prn at prn   Scheduled: . atorvastatin  40 mg Oral q1800  . cholecalciferol  2,000 Units Oral Daily  . enoxaparin (LOVENOX) injection  40 mg Subcutaneous Q24H  . escitalopram  10  mg Oral Daily  . folic acid  1 mg Oral Daily  . [START ON 09/04/2019] hydrochlorothiazide  50 mg Oral Daily  . irbesartan  300 mg Oral Daily  . metoprolol succinate  100 mg Oral Daily  . pantoprazole  40 mg Oral Daily  . predniSONE  40 mg Oral Q breakfast  . sodium chloride flush  3 mL Intravenous Once   Continuous: . sodium chloride 30 mL (09/02/19 1218)  . sodium chloride 75 mL/hr at 09/02/19 1241  . cefTRIAXone (ROCEPHIN)  IV Stopped (09/03/19 0139)  . metronidazole 500 mg (09/03/19 1659)   NLG:XQJJHE chloride, acetaminophen, hydrALAZINE, morphine injection, ondansetron (ZOFRAN) IV, sodium chloride, traZODone Anti-infectives (From admission, onward)   Start     Dose/Rate Route Frequency Ordered Stop   09/01/19 0215  metroNIDAZOLE (FLAGYL) IVPB 500 mg     500 mg 100 mL/hr over 60 Minutes Intravenous Every 8 hours 09/01/19 0123     09/01/19 0130  cefTRIAXone (ROCEPHIN) 2 g in sodium chloride 0.9 % 100 mL IVPB     2 g 200 mL/hr over 30 Minutes Intravenous Every 24 hours 09/01/19 0123       Scheduled Meds: . atorvastatin  40 mg Oral q1800  . cholecalciferol  2,000 Units Oral Daily  . enoxaparin (LOVENOX) injection  40 mg Subcutaneous Q24H  . escitalopram  10 mg Oral Daily  . folic acid  1 mg Oral Daily  . [START ON 09/04/2019] hydrochlorothiazide  50 mg Oral Daily  . irbesartan  300 mg Oral Daily  . metoprolol succinate  100 mg Oral Daily  . pantoprazole  40 mg Oral Daily  . predniSONE  40 mg Oral Q breakfast  . sodium chloride flush  3 mL Intravenous Once   Continuous Infusions: . sodium chloride 30 mL (09/02/19 1218)  . sodium chloride 75 mL/hr at 09/02/19 1241  . cefTRIAXone (ROCEPHIN)  IV Stopped (09/03/19 0139)  . metronidazole 500 mg (09/03/19 1659)   PRN Meds:.sodium chloride, acetaminophen, hydrALAZINE, morphine injection, ondansetron (ZOFRAN) IV, sodium  chloride, traZODone   Assessment: Principal Problem:   Colitis Active Problems:   Cyst of right ovary    HLD (hyperlipidemia)   Essential (primary) hypertension   AKI (acute kidney injury) ([place])   Hypokalemia  Left-sided colitis -moderate to severe confirmed on colonoscopy on 4/13 Pathology is pending CRP is elevated Fecal calprotectin levels collected Stool studies negative for infection  Plan: We will empirically start prednisone 40 mg daily while awaiting pathology results due to ongoing hematochezia Continue empiric antibiotics for now Continue full liquid diet   LOS: 3 days   Enda Santo 09/03/2019, 6:50 PM

## 2019-09-03 NOTE — Progress Notes (Signed)
PROGRESS NOTE    Kerry Jefferson  TMH:[id number] DOB: 1946/02/11 DOA: 08/31/2019 PCP: Idelle Crouch, MD   Brief Narrative:   Kerry Jefferson is a 74 y.o. female with medical history significant for recurrent diverticulitis, history of C. difficile, hypertension hyperlipidemia who presents with concerns of worsening left lower quadrant pain.  About 30 minutes following eating lunch today patient noted worsening lower abdominal cramping.  She also had an episode of vomiting.  Also had 2-3 episodes of diarrhea today and noted bright red blood per rectum.  She does havehemorrhoids but feels that this is more than her usual bleeding that she gets with her hemorrhoids.  She has had 3 episodes of diverticulitis in the past 6 months and recently finished a course of Augmentin a few days ago.  She is followed by GI outpatient and they have suggested a potential sigmoid colectomy in the future if her symptoms persist. CT abdomen with inflammatory changes involving the splenic flexure, descending colon and proximal sigmoid colon, most suggestive of colitis.  Subjective: Pt complained of pulsation in her right ear and feeling swimming headed, about 10 minutes after receiving extra dose of HCTZ and PRN hydralazine due to elevated BP.  No more abdominal pain.  No fever, dyspnea, chest pain, N/V/D.   Assessment & Plan:   Principal Problem:   Colitis Active Problems:   Cyst of right ovary   HLD (hyperlipidemia)   Essential (primary) hypertension   AKI (acute kidney injury) ([facility])   Hypokalemia  Colitis with history of recurrent diverticulitis Surprisingly patient's diverticulitis has resolved but now has colitis higher in the splenic flexure in descending colon mild sigmoid colon.  Continue to have tenderness in left lower quadrant.  No nausea, vomiting or diarrhea since hospitalization.  C. difficile and GI pathogen panel was negative. -colonoscopy showed severe inflammation, erythema and ulceration  involving colon from splenic flexure till proximal sigmoid.  Biopsies were taken-pending results. -Continue Rocephin and Flagyl. -advance her to full liquid diet.  --will empirically start prednisone 40 mg daily while awaiting pathology results due to ongoing hematochezia, per GI  AKI.  Resolved. -Monitor renal functions.  Hypokalemia.  Most likely secondary to GI losses -Monitor electrolytes, replete as needed.  Hypertension.  Blood pressure remained elevated today. --continue home home dose of olmesartan --increase home HCTZ to 50 mg daily  Hyperlipidemia. -Continue statin   Objective: Vitals:   09/03/19 1221 09/03/19 1519 09/03/19 1623 09/03/19 1953  BP: (!) 177/74 (!) 128/59 121/60 (!) 150/74  Pulse: 61 77 75 74  Resp: 16 16 16 18   Temp: 98.5 F (36.9 C)  98.2 F (36.8 C) 98.7 F (37.1 C)  TempSrc: Oral  Oral Oral  SpO2: 97% 97% 95% 96%  Weight:      Height:        Intake/Output Summary (Last 24 hours) at 09/03/2019 1953 Last data filed at 09/03/2019 1523 Gross per 24 hour  Intake 300 ml  Output --  Net 300 ml   Filed Weights   08/31/19 1700  Weight: 84.8 kg    Examination:  Constitutional: NAD, AAOx3 HEENT: conjunctivae and lids normal, EOMI CV: RRR 3+ systolic murmur at right upper sternal boarder. Distal pulses +2.  No cyanosis.   RESP: CTA B/L, normal respiratory effort  GI: +BS, NTND Extremities: No effusions, edema, or tenderness in BLE SKIN: warm, dry and intact Neuro: II - XII grossly intact.  Sensation intact Psych: Normal mood and affect.  Appropriate judgement and reason  DVT prophylaxis: Lovenox Code Status: Full Family Communication:  Disposition Plan:  Home when GI clears, currently still on IV abx and not on full diet yet.   Consultants:   GI  Procedures:  Antimicrobials:  Rocephin Flagyl  Data Reviewed: I have personally reviewed following labs and imaging studies  CBC: Recent Labs  Lab 08/31/19 1713 09/01/19 0527  09/02/19 0538 09/03/19 0549  WBC 20.0* 16.6* 19.3* 14.0*  HGB 13.9 12.3 11.8* 11.1*  HCT 42.0 36.7 36.2 34.1*  MCV 87.7 87.2 88.9 88.3  PLT 193 169 172 329   Basic Metabolic Panel: Recent Labs  Lab 08/31/19 1713 09/01/19 0527 09/02/19 0538 09/03/19 0549  NA 138 139 142 140  K 3.4* 3.6 3.3* 3.5  CL 101 104 108 106  CO2 27 27 26 25   GLUCOSE 118* 116* 111* 89  BUN 24* 22 15 12   CREATININE 1.06* 0.84 0.78 0.73  CALCIUM 9.0 7.7* 7.9* 7.9*  MG  --   --  2.4  --    GFR: Estimated Creatinine Clearance: 65.3 mL/min (by C-G formula based on SCr of 0.73 mg/dL). Liver Function Tests: Recent Labs  Lab 08/31/19 1713  AST 24  ALT 20  ALKPHOS 62  BILITOT 0.7  PROT 7.0  ALBUMIN 4.2   Recent Labs  Lab 08/31/19 1713  LIPASE 45   No results for input(s): AMMONIA in the last 168 hours. Coagulation Profile: No results for input(s): INR, PROTIME in the last 168 hours. Cardiac Enzymes: No results for input(s): CKTOTAL, CKMB, CKMBINDEX, TROPONINI in the last 168 hours. BNP (last 3 results) No results for input(s): PROBNP in the last 8760 hours. HbA1C: No results for input(s): HGBA1C in the last 72 hours. CBG: No results for input(s): GLUCAP in the last 168 hours. Lipid Profile: No results for input(s): CHOL, HDL, LDLCALC, TRIG, CHOLHDL, LDLDIRECT in the last 72 hours. Thyroid Function Tests: No results for input(s): TSH, T4TOTAL, FREET4, T3FREE, THYROIDAB in the last 72 hours. Anemia Panel: No results for input(s): VITAMINB12, FOLATE, FERRITIN, TIBC, IRON, RETICCTPCT in the last 72 hours. Sepsis Labs: Recent Labs  Lab 08/31/19 2202  LATICACIDVEN 1.2    Recent Results (from the past 240 hour(s))  Blood culture (routine x 2)     Status: None (Preliminary result)   Collection Time: 08/31/19 10:02 PM   Specimen: BLOOD  Result Value Ref Range Status   Specimen Description BLOOD RIGHT [facility]  Final   Special Requests   Final    BOTTLES DRAWN AEROBIC AND ANAEROBIC Blood Culture  adequate volume   Culture   Final    NO GROWTH 3 DAYS Performed at [facility], [address] [place], [place] [address]    Report Status PENDING  Incomplete  Blood culture (routine x 2)     Status: None (Preliminary result)   Collection Time: 08/31/19 10:24 PM   Specimen: BLOOD  Result Value Ref Range Status   Specimen Description BLOOD RIGHT ARM  Final   Special Requests   Final    BOTTLES DRAWN AEROBIC AND ANAEROBIC Blood Culture adequate volume   Culture   Final    NO GROWTH 3 DAYS Performed at [facility], [address] [place], [place] [address]    Report Status PENDING  Incomplete  Gastrointestinal Panel by PCR , Stool     Status: None   Collection Time: 09/01/19 12:25 AM   Specimen: Nasopharyngeal Swab; Stool  Result Value Ref Range Status   Campylobacter species NOT DETECTED  NOT DETECTED Final   Plesimonas shigelloides NOT DETECTED NOT DETECTED Final   Salmonella species NOT DETECTED NOT DETECTED Final   Yersinia enterocolitica NOT DETECTED NOT DETECTED Final   Vibrio species NOT DETECTED NOT DETECTED Final   Vibrio cholerae NOT DETECTED NOT DETECTED Final   Enteroaggregative E coli (EAEC) NOT DETECTED NOT DETECTED Final   Enteropathogenic E coli (EPEC) NOT DETECTED NOT DETECTED Final   Enterotoxigenic E coli (ETEC) NOT DETECTED NOT DETECTED Final   Shiga like toxin producing E coli (STEC) NOT DETECTED NOT DETECTED Final   Shigella/Enteroinvasive E coli (EIEC) NOT DETECTED NOT DETECTED Final   Cryptosporidium NOT DETECTED NOT DETECTED Final   Cyclospora cayetanensis NOT DETECTED NOT DETECTED Final   Entamoeba histolytica NOT DETECTED NOT DETECTED Final   Giardia lamblia NOT DETECTED NOT DETECTED Final   Adenovirus F40/41 NOT DETECTED NOT DETECTED Final   Astrovirus NOT DETECTED NOT DETECTED Final   Norovirus GI/GII NOT DETECTED NOT DETECTED Final   Rotavirus A NOT DETECTED NOT DETECTED Final   Sapovirus (I, II, IV, and V) NOT DETECTED NOT  DETECTED Final    Comment: Performed at [facility], [address] [place], [place] [address]  C Difficile Quick Screen w PCR reflex     Status: None   Collection Time: 09/01/19 12:25 AM   Specimen: Nasopharyngeal Swab; Stool  Result Value Ref Range Status   C Diff antigen NEGATIVE NEGATIVE Final   C Diff toxin NEGATIVE NEGATIVE Final   C Diff interpretation No C. difficile detected.  Final    Comment: Performed at [facility], [place], [place] [address]  SARS CORONAVIRUS 2 (TAT 6-24 HRS) Nasopharyngeal Nasopharyngeal Swab     Status: None   Collection Time: 09/01/19 12:25 AM   Specimen: Nasopharyngeal Swab  Result Value Ref Range Status   SARS Coronavirus 2 NEGATIVE NEGATIVE Final    Comment: (NOTE) SARS-CoV-2 target nucleic acids are NOT DETECTED. The SARS-CoV-2 RNA is generally detectable in upper and lower respiratory specimens during the acute phase of infection. Negative results do not preclude SARS-CoV-2 infection, do not rule out co-infections with other pathogens, and should not be used as the sole basis for treatment or other patient management decisions. Negative results must be combined with clinical observations, patient history, and epidemiological information. The expected result is Negative. Fact Sheet for Patients: SugarRoll.be Fact Sheet for Healthcare Providers: https://www.woods-mathews.com/ This test is not yet approved or cleared by the [place] FDA and  has been authorized for detection and/or diagnosis of SARS-CoV-2 by FDA under an Emergency Use Authorization (EUA). This EUA will remain  in effect (meaning this test can be used) for the duration of the COVID-19 declaration under Section 56 4(b)(1) of the Act, 21 U.S.C. section 360bbb-3(b)(1), unless the authorization is terminated or revoked sooner. Performed at [facility], [place] [address] [place],  [place] [address]      Radiology Studies: No results found.  Scheduled Meds: . atorvastatin  40 mg Oral q1800  . cholecalciferol  2,000 Units Oral Daily  . enoxaparin (LOVENOX) injection  40 mg Subcutaneous Q24H  . escitalopram  10 mg Oral Daily  . folic acid  1 mg Oral Daily  . [START ON 09/04/2019] hydrochlorothiazide  50 mg Oral Daily  . irbesartan  300 mg Oral Daily  . metoprolol succinate  100 mg Oral Daily  . pantoprazole  40 mg Oral Daily  . predniSONE  40 mg Oral Q breakfast  .  sodium chloride flush  3 mL Intravenous Once   Continuous Infusions: . sodium chloride 30 mL (09/02/19 1218)  . sodium chloride 75 mL/hr at 09/02/19 1241  . cefTRIAXone (ROCEPHIN)  IV Stopped (09/03/19 0139)  . metronidazole 500 mg (09/03/19 1659)     LOS: 3 days    Enzo Bi, MD [facility]  If 7PM-7AM, please contact night-coverage [email or web address]  09/03/2019, 7:53 PM

## 2019-09-04 LAB — BASIC METABOLIC PANEL
Anion gap: 8 (ref 5–15)
BUN: 10 mg/dL (ref 8–23)
CO2: 26 mmol/L (ref 22–32)
Calcium: 8.4 mg/dL — ABNORMAL LOW (ref 8.9–10.3)
Chloride: 103 mmol/L (ref 98–111)
Creatinine, Ser: 0.81 mg/dL (ref 0.44–1.00)
GFR calc Af Amer: >60 mL/min (ref >60)
GFR calc non Af Amer: >60 mL/min (ref >60)
Glucose, Bld: 105 mg/dL — ABNORMAL HIGH (ref 70–99)
Potassium: 3.1 mmol/L — ABNORMAL LOW (ref 3.5–5.1)
Sodium: 137 mmol/L (ref 135–145)

## 2019-09-04 LAB — MAGNESIUM: Magnesium: 2 mg/dL (ref 1.7–2.4)

## 2019-09-04 LAB — CBC
HCT: 34.4 % — ABNORMAL LOW (ref 36.0–46.0)
Hemoglobin: 11.6 g/dL — ABNORMAL LOW (ref 12.0–15.0)
MCH: 29.1 pg (ref 26.0–34.0)
MCHC: 33.7 g/dL (ref 30.0–36.0)
MCV: 86.4 fL (ref 80.0–100.0)
Platelets: 182 10*3/uL (ref 150–400)
RBC: 3.98 MIL/uL (ref 3.87–5.11)
RDW: 14.1 % (ref 11.5–15.5)
WBC: 11.7 10*3/uL — ABNORMAL HIGH (ref 4.0–10.5)
nRBC: 0 % (ref 0.0–0.2)

## 2019-09-04 LAB — SURGICAL PATHOLOGY

## 2019-09-04 MED ORDER — POTASSIUM CHLORIDE CRYS ER 20 MEQ PO TBCR
40.0000 meq | EXTENDED_RELEASE_TABLET | Freq: Once | ORAL | Status: AC
  Administered 2019-09-04: 10:00:00 40 meq via ORAL
  Filled 2019-09-04: qty 2

## 2019-09-04 MED ORDER — CIPROFLOXACIN HCL 500 MG PO TABS: 500.0000 mg | ORAL_TABLET | Freq: Two times a day (BID) | ORAL | Status: DC

## 2019-09-04 MED ORDER — FUROSEMIDE 10 MG/ML IJ SOLN
40.0000 mg | Freq: Once | INTRAMUSCULAR | Status: AC
  Administered 2019-09-04: 40 mg via INTRAVENOUS
  Filled 2019-09-04 (×2): qty 4

## 2019-09-04 MED ORDER — METRONIDAZOLE 500 MG PO TABS
500.0000 mg | ORAL_TABLET | Freq: Three times a day (TID) | ORAL | Status: DC
  Filled 2019-09-04 (×2): qty 1

## 2019-09-04 MED ORDER — AMOXICILLIN-POT CLAVULANATE 875-125 MG PO TABS: 1.0000 | ORAL_TABLET | Freq: Two times a day (BID) | ORAL | 0 refills | Status: AC

## 2019-09-04 MED ORDER — AMOXICILLIN-POT CLAVULANATE 875-125 MG PO TABS
1.0000 | ORAL_TABLET | Freq: Two times a day (BID) | ORAL | Status: DC
  Administered 2019-09-04: 1 via ORAL
  Filled 2019-09-04: qty 1

## 2019-09-04 NOTE — Discharge Summary (Signed)
Physician Discharge Summary   Kerry Jefferson  female DOB: 1945/11/10  NTI:[id number]  PCP: Kerry Crouch, MD  Admit date: 08/31/2019 Discharge date: 09/04/2019  Admitted From: home Disposition:  home CODE STATUS: Full code  Discharge Instructions    Diet - low sodium heart healthy   Complete by: As directed    Discharge instructions   Complete by: As directed    Biopsy did not show inflammatory bowel disease, so that's good!  Please follow up with GI Dr. Alice Jefferson for possible ischemic colitis.   You have received 5 days of IV antibiotics.  Please finish 5 more days of oral antibiotic Augmentin after you go home.  Your blood pressure has been high during your hospitalization.  I didn't change your home blood pressure medications.  Please keep a log of your blood pressure at home and follow up with your PCP to adjust blood pressure medications as needed.   Dr. Enzo Jefferson - -   Increase activity slowly   Complete by: As directed        Hospital Course:  For full details, please see H&P, progress notes, consult notes and ancillary notes.  Briefly,  Kerry Rubel Fosteris a 74 y.o.Caucasian femalewith medical history significant forrecurrent diverticulitis, history of C. difficile, hypertension who presented with concerns of worsening left lower quadrant pain about 30 minutes following eating.  She also hadan episode of vomiting. Also had 2-3 episodes of diarrhea and noted bright red blood per rectum.   She had had 3 episodes of diverticulitis in the past 6 months and recently finished a course of Augmentin. She is followed by GI outpatient.  CT abdomen with inflammatory changes involving the splenic flexure, descending colon and proximal sigmoid colon, most suggestive of colitis.  Colitis with history of recurrent diverticulitis Surprisingly patient's diverticulitis had resolved but now has colitis higher in the splenic flexure in descending colon mild sigmoid colon.  C.  difficile and GI pathogen panel was negative.  Colonoscopy showed severe inflammation, erythema and ulceration involving colon from splenic flexure till proximal sigmoid.  Biopsies were taken which returned neg for inflammatory bowel diseases.  Top DDx from GI was ischemic colitis.  Pt received 5 days of Rocephin and Flagyl and was discharged on 5 more days of Augmentin.  Prior to discharge, pt was tolerating full diet and without more abdominal pain.  Pt was advised to follow up with her outpatient GI for further workup for possible ischemic colitis.  AKI, ruled out   Cr 1.06 on presentation.  Baseline around 0.8.  Did not meet criteria for AKI.  Hypokalemia.  Most likely secondary to GI losses.  Repleted PRN.  Hypertension  Blood pressure remained elevated despite being on home BP medication.  Pt was given PO hydralazine but had pulsatile sensation and ringing in her right ear which pt believed was due to hydralazine.  Symptoms resolved about 8 hours after hydralazine dose.  Pt was discharged on home BP regimen and advised to monitor BP in the home setting and further titrate per outpatient PCP.  Hyperlipidemia. Continued statin   Discharge Diagnoses:  Principal Problem:   Colitis Active Problems:   Cyst of right ovary   HLD (hyperlipidemia)   Essential (primary) hypertension   AKI (acute kidney injury) ([place])   Hypokalemia    Discharge Instructions:  Allergies as of 09/04/2019      Reactions   Atenolol Other (See Comments)   fatigue   Ciprofloxacin Other (See Comments)  Caused drug induced hepatitis / c-diff   Ciprofloxacin Hcl Other (See Comments)   Hepatitis   Lisinopril Other (See Comments)   Fatigue   Moxifloxacin Other (See Comments)   Hepatitis   Sertraline Other (See Comments)      Medication List    TAKE these medications   amoxicillin-clavulanate 875-125 MG tablet Commonly known as: AUGMENTIN Take 1 tablet by mouth every 12 (twelve) hours for 5 days.  Antibiotic.   cholecalciferol 25 MCG (1000 UNIT) tablet Commonly known as: VITAMIN D Take 2,000 Units by mouth daily.   COQ-10 PO Take 1 tablet by mouth daily.   desonide 0.05 % cream Commonly known as: DESOWEN   escitalopram 10 MG tablet Commonly known as: LEXAPRO Take 1 tablet by mouth daily.   fluticasone 50 MCG/ACT nasal spray Commonly known as: FLONASE USE TWO SPRAY(S) IN EACH NOSTRIL ONCE DAILY.   folic acid 545 MCG tablet Commonly known as: FOLVITE Take by mouth.   ketoconazole 2 % cream Commonly known as: NIZORAL   NexIUM 20 MG capsule Generic drug: esomeprazole Take 20 mg by mouth as needed.   olmesartan-hydrochlorothiazide 40-12.5 MG tablet Commonly known as: BENICAR HCT Take 1 tablet by mouth daily.   simvastatin 80 MG tablet Commonly known as: ZOCOR Take 40 mg by mouth daily at 6 PM.   Toprol XL 100 MG 24 hr tablet Generic drug: metoprolol succinate Take 100 mg by mouth daily.   traZODone 50 MG tablet Commonly known as: DESYREL Take 50 mg by mouth at bedtime as needed.       Follow-up Information    Kerry Crouch, MD. Schedule an appointment as soon as possible for a visit in 1 week(s).   Specialty: Internal Medicine Contact information: [facility] [place] [address] [phone]        Kerry Sella, MD Follow up.   Specialty: Gastroenterology Why: for possible ischemic colitis Contact information: [place] [address] [phone]           Allergies  Allergen Reactions  . Atenolol Other (See Comments)    fatigue  . Ciprofloxacin Other (See Comments)    Caused drug induced hepatitis / c-diff  . Ciprofloxacin Hcl Other (See Comments)    Hepatitis  . Lisinopril Other (See Comments)    Fatigue  . Moxifloxacin Other (See Comments)    Hepatitis  . Sertraline Other (See Comments)     The results of significant diagnostics from this hospitalization (including  imaging, microbiology, ancillary and laboratory) are listed below for reference.   Consultations:   Procedures/Studies: CT ABDOMEN PELVIS W CONTRAST  Result Date: 08/31/2019 CLINICAL DATA:  History of diverticulitis, abdominal cramping with bleeding and vomiting EXAM: CT ABDOMEN AND PELVIS WITH CONTRAST TECHNIQUE: Multidetector CT imaging of the abdomen and pelvis was performed using the standard protocol following bolus administration of intravenous contrast. CONTRAST:  160m OMNIPAQUE IOHEXOL 300 MG/ML  SOLN COMPARISON:  CT 04/03/2019 FINDINGS: Lower chest: Lung bases demonstrate no acute consolidation or pleural effusion. Heart size upper limits of normal. Hepatobiliary: No focal liver abnormality is seen. Status post cholecystectomy. No biliary dilatation. Pancreas: Unremarkable. No pancreatic ductal dilatation or surrounding inflammatory changes. Spleen: Normal in size without focal abnormality. Adrenals/Urinary Tract: Subcentimeter hypodensity in the lower pole right kidney. Adrenal glands are normal. No hydronephrosis. The bladder is normal Stomach/Bowel: Stomach is nonenlarged. No dilated small bowel. Negative appendix. The previously noted inflammatory changes at the sigmoid colon have improved. Now  seen are new inflammatory changes involving the splenic flexure, descending colon and proximal sigmoid colon. No extraluminal gas. Vascular/Lymphatic: Mild aortic atherosclerosis. No aneurysm. No suspicious adenopathy Reproductive: Status post hysterectomy. 3.2 cm right adnexal cyst, unchanged since 04/03/2019. Other: Negative for free air or free fluid. Musculoskeletal: No acute or suspicious osseous abnormality IMPRESSION: 1. Previously noted inflammatory changes at the mid to distal sigmoid colon have improved. There is however interim development of wall thickening and inflammatory change involving the splenic flexure, descending colon and proximal sigmoid colon. Although there are diverticula  present at the proximal sigmoid colon, given length of involvement, suspect that the findings are related to colitis of infectious, inflammatory, or ischemic etiology. There is no evidence for a bowel obstruction. Negative for perforation or abscess. 2. 3.2 cm right adnexal cyst. Recommend follow-up pelvic ultrasound. This may be performed on a nonemergent basis. Electronically Signed   By: Donavan Foil M.D.   On: 08/31/2019 22:48      Labs: BNP (last 3 results) No results for input(s): BNP in the last 8760 hours. Basic Metabolic Panel: Recent Labs  Lab 08/31/19 1713 09/01/19 0527 09/02/19 0538 09/03/19 0549 09/04/19 0452  NA 138 139 142 140 137  K 3.4* 3.6 3.3* 3.5 3.1*  CL 101 104 108 106 103  CO2 27 27 26 25 26   GLUCOSE 118* 116* 111* 89 105*  BUN 24* 22 15 12 10   CREATININE 1.06* 0.84 0.78 0.73 0.81  CALCIUM 9.0 7.7* 7.9* 7.9* 8.4*  MG  --   --  2.4  --  2.0   Liver Function Tests: Recent Labs  Lab 08/31/19 1713  AST 24  ALT 20  ALKPHOS 62  BILITOT 0.7  PROT 7.0  ALBUMIN 4.2   Recent Labs  Lab 08/31/19 1713  LIPASE 45   No results for input(s): AMMONIA in the last 168 hours. CBC: Recent Labs  Lab 08/31/19 1713 09/01/19 0527 09/02/19 0538 09/03/19 0549 09/04/19 0452  WBC 20.0* 16.6* 19.3* 14.0* 11.7*  HGB 13.9 12.3 11.8* 11.1* 11.6*  HCT 42.0 36.7 36.2 34.1* 34.4*  MCV 87.7 87.2 88.9 88.3 86.4  PLT 193 169 172 166 182   Cardiac Enzymes: No results for input(s): CKTOTAL, CKMB, CKMBINDEX, TROPONINI in the last 168 hours. BNP: Invalid input(s): POCBNP CBG: No results for input(s): GLUCAP in the last 168 hours. D-Dimer No results for input(s): DDIMER in the last 72 hours. Hgb A1c No results for input(s): HGBA1C in the last 72 hours. Lipid Profile No results for input(s): CHOL, HDL, LDLCALC, TRIG, CHOLHDL, LDLDIRECT in the last 72 hours. Thyroid function studies No results for input(s): TSH, T4TOTAL, T3FREE, THYROIDAB in the last 72  hours.  Invalid input(s): FREET3 Anemia work up No results for input(s): VITAMINB12, FOLATE, FERRITIN, TIBC, IRON, RETICCTPCT in the last 72 hours. Urinalysis    Component Value Date/Time   COLORURINE AMBER (A) 08/31/2019 1713   APPEARANCEUR HAZY (A) 08/31/2019 1713   LABSPEC 1.021 08/31/2019 1713   PHURINE 5.0 08/31/2019 1713   GLUCOSEU NEGATIVE 08/31/2019 1713   HGBUR NEGATIVE 08/31/2019 1713   BILIRUBINUR NEGATIVE 08/31/2019 1713   KETONESUR 5 (A) 08/31/2019 1713   PROTEINUR NEGATIVE 08/31/2019 1713   NITRITE NEGATIVE 08/31/2019 1713   LEUKOCYTESUR NEGATIVE 08/31/2019 1713   Sepsis Labs Invalid input(s): PROCALCITONIN,  WBC,  LACTICIDVEN Microbiology Recent Results (from the past 240 hour(s))  Blood culture (routine x 2)     Status: None (Preliminary result)   Collection Time: 08/31/19 10:02 PM  Specimen: BLOOD  Result Value Ref Range Status   Specimen Description BLOOD RIGHT [facility]  Final   Special Requests   Final    BOTTLES DRAWN AEROBIC AND ANAEROBIC Blood Culture adequate volume   Culture   Final    NO GROWTH 4 DAYS Performed at [facility], [address] [place], [place] [address]    Report Status PENDING  Incomplete  Blood culture (routine x 2)     Status: None (Preliminary result)   Collection Time: 08/31/19 10:24 PM   Specimen: BLOOD  Result Value Ref Range Status   Specimen Description BLOOD RIGHT ARM  Final   Special Requests   Final    BOTTLES DRAWN AEROBIC AND ANAEROBIC Blood Culture adequate volume   Culture   Final    NO GROWTH 4 DAYS Performed at [facility], [address], [place], [place] [address]    Report Status PENDING  Incomplete  Gastrointestinal Panel by PCR , Stool     Status: None   Collection Time: 09/01/19 12:25 AM   Specimen: Nasopharyngeal Swab; Stool  Result Value Ref Range Status   Campylobacter species NOT DETECTED NOT DETECTED Final   Plesimonas shigelloides NOT DETECTED NOT DETECTED Final   Salmonella  species NOT DETECTED NOT DETECTED Final   Yersinia enterocolitica NOT DETECTED NOT DETECTED Final   Vibrio species NOT DETECTED NOT DETECTED Final   Vibrio cholerae NOT DETECTED NOT DETECTED Final   Enteroaggregative E coli (EAEC) NOT DETECTED NOT DETECTED Final   Enteropathogenic E coli (EPEC) NOT DETECTED NOT DETECTED Final   Enterotoxigenic E coli (ETEC) NOT DETECTED NOT DETECTED Final   Shiga like toxin producing E coli (STEC) NOT DETECTED NOT DETECTED Final   Shigella/Enteroinvasive E coli (EIEC) NOT DETECTED NOT DETECTED Final   Cryptosporidium NOT DETECTED NOT DETECTED Final   Cyclospora cayetanensis NOT DETECTED NOT DETECTED Final   Entamoeba histolytica NOT DETECTED NOT DETECTED Final   Giardia lamblia NOT DETECTED NOT DETECTED Final   Adenovirus F40/41 NOT DETECTED NOT DETECTED Final   Astrovirus NOT DETECTED NOT DETECTED Final   Norovirus GI/GII NOT DETECTED NOT DETECTED Final   Rotavirus A NOT DETECTED NOT DETECTED Final   Sapovirus (I, II, IV, and V) NOT DETECTED NOT DETECTED Final    Comment: Performed at [facility], [address] [place], [place] [address]  C Difficile Quick Screen w PCR reflex     Status: None   Collection Time: 09/01/19 12:25 AM   Specimen: Nasopharyngeal Swab; Stool  Result Value Ref Range Status   C Diff antigen NEGATIVE NEGATIVE Final   C Diff toxin NEGATIVE NEGATIVE Final   C Diff interpretation No C. difficile detected.  Final    Comment: Performed at [facility], [place], [place] [address]  SARS CORONAVIRUS 2 (TAT 6-24 HRS) Nasopharyngeal Nasopharyngeal Swab     Status: None   Collection Time: 09/01/19 12:25 AM   Specimen: Nasopharyngeal Swab  Result Value Ref Range Status   SARS Coronavirus 2 NEGATIVE NEGATIVE Final    Comment: (NOTE) SARS-CoV-2 target nucleic acids are NOT DETECTED. The SARS-CoV-2 RNA is generally detectable in upper and lower respiratory specimens during the acute phase of  infection. Negative results do not preclude SARS-CoV-2 infection, do not rule out co-infections with other pathogens, and should not be used as the sole basis for treatment or other patient management decisions. Negative results must be combined with clinical observations, patient history, and epidemiological information. The expected  result is Negative. Fact Sheet for Patients: SugarRoll.be Fact Sheet for Healthcare Providers: https://www.woods-mathews.com/ This test is not yet approved or cleared by the [place] FDA and  has been authorized for detection and/or diagnosis of SARS-CoV-2 by FDA under an Emergency Use Authorization (EUA). This EUA will remain  in effect (meaning this test can be used) for the duration of the COVID-19 declaration under Section 56 4(b)(1) of the Act, 21 U.S.C. section 360bbb-3(b)(1), unless the authorization is terminated or revoked sooner. Performed at [facility], [place] [address], [place], [place] [address]      Total time spend on discharging this patient, including the last patient exam, discussing the hospital stay, instructions for ongoing care as it relates to all pertinent caregivers, as well as preparing the medical discharge records, prescriptions, and/or referrals as applicable, is 35 minutes.    Kerry Bi, MD  [facility] 09/04/2019, 1:29 PM  If 7PM-7AM, please contact night-coverage

## 2019-09-04 NOTE — Progress Notes (Signed)
Kerry Jefferson  A and O x 4. VSS. Pt tolerating diet well. No complaints of pain or nausea. IV removed intact, prescriptions given. Pt voiced understanding of discharge instructions with no further questions. Pt discharged via wheelchair with NT,.     Allergies as of 09/04/2019      Reactions   Atenolol Other (See Comments)   fatigue   Ciprofloxacin Other (See Comments)   Caused drug induced hepatitis / c-diff   Ciprofloxacin Hcl Other (See Comments)   Hepatitis   Lisinopril Other (See Comments)   Fatigue   Moxifloxacin Other (See Comments)   Hepatitis   Sertraline Other (See Comments)      Medication List    TAKE these medications   amoxicillin-clavulanate 875-125 MG tablet Commonly known as: AUGMENTIN Take 1 tablet by mouth every 12 (twelve) hours for 5 days. Antibiotic.   cholecalciferol 25 MCG (1000 UNIT) tablet Commonly known as: VITAMIN D Take 2,000 Units by mouth daily.   COQ-10 PO Take 1 tablet by mouth daily.   desonide 0.05 % cream Commonly known as: DESOWEN   escitalopram 10 MG tablet Commonly known as: LEXAPRO Take 1 tablet by mouth daily.   fluticasone 50 MCG/ACT nasal spray Commonly known as: FLONASE USE TWO SPRAY(S) IN EACH NOSTRIL ONCE DAILY.   folic acid 026 MCG tablet Commonly known as: FOLVITE Take by mouth.   ketoconazole 2 % cream Commonly known as: NIZORAL   NexIUM 20 MG capsule Generic drug: esomeprazole Take 20 mg by mouth as needed.   olmesartan-hydrochlorothiazide 40-12.5 MG tablet Commonly known as: BENICAR HCT Take 1 tablet by mouth daily.   simvastatin 80 MG tablet Commonly known as: ZOCOR Take 40 mg by mouth daily at 6 PM.   Toprol XL 100 MG 24 hr tablet Generic drug: metoprolol succinate Take 100 mg by mouth daily.   traZODone 50 MG tablet Commonly known as: DESYREL Take 50 mg by mouth at bedtime as needed.       Vitals:   09/04/19 1446 09/04/19 1449  BP: (!) 162/76 (!) 155/77  Pulse: 61 61  Resp:    Temp:     SpO2:      Francesco Sor

## 2019-09-05 LAB — CULTURE, BLOOD (ROUTINE X 2)
Culture: NO GROWTH
Culture: NO GROWTH
Special Requests: ADEQUATE
Special Requests: ADEQUATE

## 2019-09-06 LAB — CALPROTECTIN, FECAL: Calprotectin, Fecal: 5699 ug/g — ABNORMAL HIGH (ref 0–120)

## 2019-09-08 ENCOUNTER — Other Ambulatory Visit: Payer: Self-pay | Admitting: Gastroenterology

## 2019-09-08 DIAGNOSIS — K559 Vascular disorder of intestine, unspecified: Secondary | ICD-10-CM

## 2019-09-15 ENCOUNTER — Ambulatory Visit
Admission: RE | Admit: 2019-09-15 | Discharge: 2019-09-15 | Disposition: A | Discharge status: Home / Self Care | Payer: Medicare Other | Source: Ambulatory Visit | Attending: Gastroenterology | Admitting: Gastroenterology

## 2019-09-15 ENCOUNTER — Other Ambulatory Visit: Payer: Self-pay

## 2019-09-15 DIAGNOSIS — K559 Vascular disorder of intestine, unspecified: Secondary | ICD-10-CM | POA: Diagnosis present

## 2019-09-16 ENCOUNTER — Other Ambulatory Visit: Payer: Self-pay

## 2019-09-16 ENCOUNTER — Encounter: Payer: Self-pay | Admitting: Ophthalmology

## 2019-09-19 ENCOUNTER — Other Ambulatory Visit
Admission: RE | Admit: 2019-09-19 | Discharge: 2019-09-19 | Disposition: A | Discharge status: Home / Self Care | Payer: Medicare Other | Source: Ambulatory Visit | Attending: Ophthalmology | Admitting: Ophthalmology

## 2019-09-19 DIAGNOSIS — Z01812 Encounter for preprocedural laboratory examination: Secondary | ICD-10-CM | POA: Insufficient documentation

## 2019-09-19 DIAGNOSIS — Z20822 Contact with and (suspected) exposure to covid-19: Secondary | ICD-10-CM | POA: Insufficient documentation

## 2019-09-19 LAB — SARS CORONAVIRUS 2 (TAT 6-24 HRS): SARS Coronavirus 2: NEGATIVE

## 2019-09-19 NOTE — Discharge Instructions (Signed)

## 2019-09-23 ENCOUNTER — Ambulatory Visit: Payer: Medicare Other | Admitting: Anesthesiology

## 2019-09-23 ENCOUNTER — Encounter: Payer: Self-pay | Admitting: Ophthalmology

## 2019-09-23 ENCOUNTER — Ambulatory Visit
Admission: RE | Admit: 2019-09-23 | Discharge: 2019-09-23 | Disposition: A | Discharge status: Home / Self Care | Payer: Medicare Other | Attending: Ophthalmology | Admitting: Ophthalmology

## 2019-09-23 ENCOUNTER — Encounter
Admission: RE | Disposition: A | Discharge status: Home / Self Care | Payer: Self-pay | Source: Home / Self Care | Attending: Ophthalmology

## 2019-09-23 ENCOUNTER — Other Ambulatory Visit: Payer: Self-pay

## 2019-09-23 DIAGNOSIS — E7800 Pure hypercholesterolemia, unspecified: Secondary | ICD-10-CM | POA: Diagnosis not present

## 2019-09-23 DIAGNOSIS — H2511 Age-related nuclear cataract, right eye: Secondary | ICD-10-CM | POA: Diagnosis present

## 2019-09-23 DIAGNOSIS — Z6829 Body mass index (BMI) 29.0-29.9, adult: Secondary | ICD-10-CM | POA: Insufficient documentation

## 2019-09-23 DIAGNOSIS — I10 Essential (primary) hypertension: Secondary | ICD-10-CM | POA: Insufficient documentation

## 2019-09-23 DIAGNOSIS — Z85828 Personal history of other malignant neoplasm of skin: Secondary | ICD-10-CM | POA: Insufficient documentation

## 2019-09-23 DIAGNOSIS — Z87891 Personal history of nicotine dependence: Secondary | ICD-10-CM | POA: Insufficient documentation

## 2019-09-23 DIAGNOSIS — F419 Anxiety disorder, unspecified: Secondary | ICD-10-CM | POA: Diagnosis not present

## 2019-09-23 HISTORY — DX: Unspecified osteoarthritis, unspecified site: M19.90

## 2019-09-23 HISTORY — PX: CATARACT EXTRACTION W/PHACO: SHX586

## 2019-09-23 HISTORY — DX: Inflammatory liver disease, unspecified: K75.9

## 2019-09-23 HISTORY — DX: Dizziness and giddiness: R42

## 2019-09-23 SURGERY — PHACOEMULSIFICATION, CATARACT, WITH IOL INSERTION
Anesthesia: Monitor Anesthesia Care | Site: Eye | Laterality: Right

## 2019-09-23 MED ORDER — NA CHONDROIT SULF-NA HYALURON 40-17 MG/ML IO SOLN
INTRAOCULAR | Status: DC | PRN
  Administered 2019-09-23: 1 mL via INTRAOCULAR

## 2019-09-23 MED ORDER — OXYCODONE HCL 5 MG/5ML PO SOLN: 5.0000 mg | Freq: Once | ORAL | Status: DC | PRN

## 2019-09-23 MED ORDER — EPINEPHRINE PF 1 MG/ML IJ SOLN
INTRAOCULAR | Status: DC | PRN
  Administered 2019-09-23: 09:00:00 45 mL via OPHTHALMIC

## 2019-09-23 MED ORDER — ARMC OPHTHALMIC DILATING DROPS
1.0000 "application " | OPHTHALMIC | Status: DC | PRN
  Administered 2019-09-23 (×3): 1 via OPHTHALMIC

## 2019-09-23 MED ORDER — MIDAZOLAM HCL 2 MG/2ML IJ SOLN
INTRAMUSCULAR | Status: DC | PRN
  Administered 2019-09-23 (×2): 1 mg via INTRAVENOUS

## 2019-09-23 MED ORDER — MOXIFLOXACIN HCL 0.5 % OP SOLN
OPHTHALMIC | Status: DC | PRN
  Administered 2019-09-23: 0.2 mL via OPHTHALMIC

## 2019-09-23 MED ORDER — FENTANYL CITRATE (PF) 100 MCG/2ML IJ SOLN
INTRAMUSCULAR | Status: DC | PRN
  Administered 2019-09-23 (×2): 50 ug via INTRAVENOUS

## 2019-09-23 MED ORDER — TETRACAINE HCL 0.5 % OP SOLN
1.0000 [drp] | OPHTHALMIC | Status: DC | PRN
  Administered 2019-09-23 (×3): 1 [drp] via OPHTHALMIC

## 2019-09-23 MED ORDER — BRIMONIDINE TARTRATE-TIMOLOL 0.2-0.5 % OP SOLN
OPHTHALMIC | Status: DC | PRN
  Administered 2019-09-23: 1 [drp] via OPHTHALMIC

## 2019-09-23 MED ORDER — OXYCODONE HCL 5 MG PO TABS: 5.0000 mg | ORAL_TABLET | Freq: Once | ORAL | Status: DC | PRN

## 2019-09-23 MED ORDER — LIDOCAINE HCL (PF) 2 % IJ SOLN
INTRAOCULAR | Status: DC | PRN
  Administered 2019-09-23: 1 mL

## 2019-09-23 SURGICAL SUPPLY — 21 items
CANNULA ANT/CHMB 27G (MISCELLANEOUS) ×2 IMPLANT
CANNULA ANT/CHMB 27GA (MISCELLANEOUS) ×6 IMPLANT
DISSECTOR HYDRO NUCLEUS 50X22 (MISCELLANEOUS) ×3 IMPLANT
GLOVE SURG LX 8.0 MICRO (GLOVE) ×2
GLOVE SURG LX STRL 8.0 MICRO (GLOVE) ×1 IMPLANT
GLOVE SURG TRIUMPH 8.0 PF LTX (GLOVE) ×3 IMPLANT
GOWN STRL REUS W/ TWL LRG LVL3 (GOWN DISPOSABLE) ×2 IMPLANT
GOWN STRL REUS W/TWL LRG LVL3 (GOWN DISPOSABLE) ×4
LENS IOL DIOP 20.5 (Intraocular Lens) ×3 IMPLANT
LENS IOL TECNIS MONO 20.5 (Intraocular Lens) IMPLANT
MARKER SKIN DUAL TIP RULER LAB (MISCELLANEOUS) ×3 IMPLANT
NDL FILTER BLUNT 18X1 1/2 (NEEDLE) ×1 IMPLANT
NEEDLE FILTER BLUNT 18X 1/2SAF (NEEDLE) ×2
NEEDLE FILTER BLUNT 18X1 1/2 (NEEDLE) ×1 IMPLANT
PACK EYE AFTER SURG (MISCELLANEOUS) ×3 IMPLANT
PACK OPTHALMIC (MISCELLANEOUS) ×3 IMPLANT
PACK PORFILIO (MISCELLANEOUS) ×3 IMPLANT
SYR 3ML LL SCALE MARK (SYRINGE) ×3 IMPLANT
SYR TB 1ML LUER SLIP (SYRINGE) ×3 IMPLANT
WATER STERILE IRR 250ML POUR (IV SOLUTION) ×3 IMPLANT
WIPE NON LINTING 3.25X3.25 (MISCELLANEOUS) ×3 IMPLANT

## 2019-09-23 NOTE — Transfer of Care (Signed)
Immediate Anesthesia Transfer of Care Note  Patient: Kerry Jefferson  Procedure(s) Performed: CATARACT EXTRACTION PHACO AND INTRAOCULAR LENS PLACEMENT (IOC) RIGHT 4.61  00:29.6 (Right Eye)  Patient Location: PACU  Anesthesia Type: MAC  Level of Consciousness: awake, alert  and patient cooperative  Airway and Oxygen Therapy: Patient Spontanous Breathing   Post-op Assessment: Post-op Vital signs reviewed, Patient's Cardiovascular Status Stable, Respiratory Function Stable, Patent Airway and No signs of Nausea or vomiting  Post-op Vital Signs: Reviewed and stable  Complications: No apparent anesthesia complications

## 2019-09-23 NOTE — Anesthesia Postprocedure Evaluation (Signed)
Anesthesia Post Note  Patient: Kerry Jefferson  Procedure(s) Performed: CATARACT EXTRACTION PHACO AND INTRAOCULAR LENS PLACEMENT (IOC) RIGHT 4.61  00:29.6 (Right Eye)     Patient location during evaluation: PACU Anesthesia Type: MAC Level of consciousness: awake and alert Pain management: pain level controlled Vital Signs Assessment: post-procedure vital signs reviewed and stable Respiratory status: spontaneous breathing, nonlabored ventilation, respiratory function stable and patient connected to nasal cannula oxygen Cardiovascular status: stable and blood pressure returned to baseline Postop Assessment: no apparent nausea or vomiting Anesthetic complications: no    Patsie Mccardle

## 2019-09-23 NOTE — Anesthesia Procedure Notes (Signed)
Procedure Name: MAC Date/Time: 09/23/2019 8:24 AM Performed by: Vanetta Shawl, CRNA Pre-anesthesia Checklist: Patient identified, Emergency Drugs available, Suction available, Timeout performed and Patient being monitored Patient Re-evaluated:Patient Re-evaluated prior to induction Oxygen Delivery Method: Nasal cannula Placement Confirmation: positive ETCO2

## 2019-09-23 NOTE — H&P (Signed)
All labs reviewed. Abnormal studies sent to patients PCP when indicated.  Previous H&P reviewed, patient examined, there are NO CHANGES.  Kerry Heilman Porfilio5/4/20218:16 AM

## 2019-09-23 NOTE — Op Note (Signed)
PREOPERATIVE DIAGNOSIS:  Nuclear sclerotic cataract of the right eye.   POSTOPERATIVE DIAGNOSIS:  H25.11 Cataract   OPERATIVE PROCEDURE:@   SURGEON:  Birder Robson, MD.   ANESTHESIA:  Anesthesiologist: Fidel Levy, MD CRNA: Vanetta Shawl, CRNA  1.      Managed anesthesia care. 2.      0.78m of Shugarcaine was instilled in the eye following the paracentesis.   COMPLICATIONS:  None.   TECHNIQUE:   Stop and chop   DESCRIPTION OF PROCEDURE:  The patient was examined and consented in the preoperative holding area where the aforementioned topical anesthesia was applied to the right eye and then brought back to the Operating Room where the right eye was prepped and draped in the usual sterile ophthalmic fashion and a lid speculum was placed. A paracentesis was created with the side port blade and the anterior chamber was filled with viscoelastic. A near clear corneal incision was performed with the steel keratome. A continuous curvilinear capsulorrhexis was performed with a cystotome followed by the capsulorrhexis forceps. Hydrodissection and hydrodelineation were carried out with BSS on a blunt cannula. The lens was removed in a stop and chop  technique and the remaining cortical material was removed with the irrigation-aspiration handpiece. The capsular bag was inflated with viscoelastic and the Technis [id number]  lens was placed in the capsular bag without complication. The remaining viscoelastic was removed from the eye with the irrigation-aspiration handpiece. The wounds were hydrated. The anterior chamber was flushed with BSS and the eye was inflated to physiologic pressure. 0.174mof Vigamox was placed in the anterior chamber. The wounds were found to be water tight. The eye was dressed with Combigan. The patient was given protective glasses to wear throughout the day and a shield with which to sleep tonight. The patient was also given drops with which to begin a drop regimen today and will  follow-up with me in one day. Implant Name Type Inv. Item Serial No. Manufacturer Lot No. LRB No. Used Action  LENS IOL DIOP 20.5 - [id number] Lens LENS IOL DIOP 20.5 [id number]  Right 1 Implanted   Procedure(s): CATARACT EXTRACTION PHACO AND INTRAOCULAR LENS PLACEMENT (IOC) RIGHT 4.61  00:29.6 (Right)  Electronically signed: WiBirder Robson/08/2019 8:40 AM

## 2019-09-23 NOTE — Anesthesia Preprocedure Evaluation (Signed)
Anesthesia Evaluation  Patient identified by MRN, date of birth, ID band Patient awake    Reviewed: NPO status   History of Anesthesia Complications Negative for: history of anesthetic complications  Airway Mallampati: II  TM Distance: >3 FB Neck ROM: full    Dental no notable dental hx.    Pulmonary neg pulmonary ROS, former smoker,    Pulmonary exam normal        Cardiovascular Exercise Tolerance: Good hypertension, Normal cardiovascular exam  Halford Decamp, PA at 05/28/2019 : cards.    Neuro/Psych negative neurological ROS  negative psych ROS   GI/Hepatic GERD  Controlled,(+) Hepatitis - (10 years ago from antiobiotics)Colitis: 08/2018   Endo/Other  Morbid obesity (bmi 30)  Renal/GU negative Renal ROS  negative genitourinary   Musculoskeletal  (+) Arthritis ,   Abdominal   Peds  Hematology negative hematology ROS (+)   Anesthesia Other Findings Covid: NEG.  Reproductive/Obstetrics                             Anesthesia Physical Anesthesia Plan  ASA: II  Anesthesia Plan: MAC   Post-op Pain Management:    Induction:   PONV Risk Score and Plan: 2 and TIVA and Midazolam  Airway Management Planned:   Additional Equipment:   Intra-op Plan:   Post-operative Plan:   Informed Consent: I have reviewed the patients History and Physical, chart, labs and discussed the procedure including the risks, benefits and alternatives for the proposed anesthesia with the patient or authorized representative who has indicated his/her understanding and acceptance.       Plan Discussed with: CRNA  Anesthesia Plan Comments:         Anesthesia Quick Evaluation

## 2019-09-24 ENCOUNTER — Encounter: Payer: Self-pay | Admitting: *Deleted

## 2019-10-09 ENCOUNTER — Encounter: Payer: Self-pay | Admitting: Ophthalmology

## 2019-10-09 ENCOUNTER — Other Ambulatory Visit: Payer: Self-pay

## 2019-10-10 ENCOUNTER — Other Ambulatory Visit
Admission: RE | Admit: 2019-10-10 | Discharge: 2019-10-10 | Disposition: A | Discharge status: Home / Self Care | Payer: Medicare Other | Source: Ambulatory Visit | Attending: Ophthalmology | Admitting: Ophthalmology

## 2019-10-10 DIAGNOSIS — Z20822 Contact with and (suspected) exposure to covid-19: Secondary | ICD-10-CM | POA: Insufficient documentation

## 2019-10-10 DIAGNOSIS — Z01812 Encounter for preprocedural laboratory examination: Secondary | ICD-10-CM | POA: Insufficient documentation

## 2019-10-10 LAB — SARS CORONAVIRUS 2 (TAT 6-24 HRS): SARS Coronavirus 2: NEGATIVE

## 2019-10-10 NOTE — Discharge Instructions (Signed)

## 2019-10-14 ENCOUNTER — Other Ambulatory Visit: Payer: Self-pay

## 2019-10-14 ENCOUNTER — Encounter
Admission: RE | Disposition: A | Discharge status: Home / Self Care | Payer: Self-pay | Source: Home / Self Care | Attending: Ophthalmology

## 2019-10-14 ENCOUNTER — Ambulatory Visit: Payer: Medicare Other | Admitting: Anesthesiology

## 2019-10-14 ENCOUNTER — Encounter: Payer: Self-pay | Admitting: Ophthalmology

## 2019-10-14 ENCOUNTER — Ambulatory Visit
Admission: RE | Admit: 2019-10-14 | Discharge: 2019-10-14 | Disposition: A | Discharge status: Home / Self Care | Payer: Medicare Other | Attending: Ophthalmology | Admitting: Ophthalmology

## 2019-10-14 DIAGNOSIS — K219 Gastro-esophageal reflux disease without esophagitis: Secondary | ICD-10-CM | POA: Insufficient documentation

## 2019-10-14 DIAGNOSIS — R011 Cardiac murmur, unspecified: Secondary | ICD-10-CM | POA: Insufficient documentation

## 2019-10-14 DIAGNOSIS — I10 Essential (primary) hypertension: Secondary | ICD-10-CM | POA: Diagnosis not present

## 2019-10-14 DIAGNOSIS — E669 Obesity, unspecified: Secondary | ICD-10-CM | POA: Diagnosis not present

## 2019-10-14 DIAGNOSIS — E785 Hyperlipidemia, unspecified: Secondary | ICD-10-CM | POA: Diagnosis not present

## 2019-10-14 DIAGNOSIS — R519 Headache, unspecified: Secondary | ICD-10-CM | POA: Diagnosis not present

## 2019-10-14 DIAGNOSIS — Z880 Allergy status to penicillin: Secondary | ICD-10-CM | POA: Diagnosis not present

## 2019-10-14 DIAGNOSIS — Z87891 Personal history of nicotine dependence: Secondary | ICD-10-CM | POA: Diagnosis not present

## 2019-10-14 DIAGNOSIS — H2512 Age-related nuclear cataract, left eye: Secondary | ICD-10-CM | POA: Insufficient documentation

## 2019-10-14 DIAGNOSIS — E7800 Pure hypercholesterolemia, unspecified: Secondary | ICD-10-CM | POA: Insufficient documentation

## 2019-10-14 DIAGNOSIS — Z90710 Acquired absence of both cervix and uterus: Secondary | ICD-10-CM | POA: Insufficient documentation

## 2019-10-14 DIAGNOSIS — Z881 Allergy status to other antibiotic agents status: Secondary | ICD-10-CM | POA: Insufficient documentation

## 2019-10-14 DIAGNOSIS — M1990 Unspecified osteoarthritis, unspecified site: Secondary | ICD-10-CM | POA: Diagnosis not present

## 2019-10-14 DIAGNOSIS — F419 Anxiety disorder, unspecified: Secondary | ICD-10-CM | POA: Insufficient documentation

## 2019-10-14 DIAGNOSIS — N809 Endometriosis, unspecified: Secondary | ICD-10-CM | POA: Diagnosis not present

## 2019-10-14 DIAGNOSIS — I499 Cardiac arrhythmia, unspecified: Secondary | ICD-10-CM | POA: Insufficient documentation

## 2019-10-14 DIAGNOSIS — Z6829 Body mass index (BMI) 29.0-29.9, adult: Secondary | ICD-10-CM | POA: Insufficient documentation

## 2019-10-14 DIAGNOSIS — Z9049 Acquired absence of other specified parts of digestive tract: Secondary | ICD-10-CM | POA: Diagnosis not present

## 2019-10-14 DIAGNOSIS — R002 Palpitations: Secondary | ICD-10-CM | POA: Insufficient documentation

## 2019-10-14 DIAGNOSIS — Z9842 Cataract extraction status, left eye: Secondary | ICD-10-CM | POA: Insufficient documentation

## 2019-10-14 DIAGNOSIS — R42 Dizziness and giddiness: Secondary | ICD-10-CM | POA: Diagnosis not present

## 2019-10-14 HISTORY — PX: CATARACT EXTRACTION W/PHACO: SHX586

## 2019-10-14 SURGERY — PHACOEMULSIFICATION, CATARACT, WITH IOL INSERTION
Anesthesia: Monitor Anesthesia Care | Site: Eye | Laterality: Left

## 2019-10-14 MED ORDER — LACTATED RINGERS IV SOLN: 100.0000 mL/h | INTRAVENOUS | Status: DC

## 2019-10-14 MED ORDER — TETRACAINE HCL 0.5 % OP SOLN
1.0000 [drp] | OPHTHALMIC | Status: DC | PRN
  Administered 2019-10-14 (×3): 1 [drp] via OPHTHALMIC

## 2019-10-14 MED ORDER — EPINEPHRINE PF 1 MG/ML IJ SOLN
INTRAOCULAR | Status: DC | PRN
  Administered 2019-10-14: 50 mL via OPHTHALMIC

## 2019-10-14 MED ORDER — ARMC OPHTHALMIC DILATING DROPS
1.0000 "application " | OPHTHALMIC | Status: DC | PRN
  Administered 2019-10-14 (×3): 1 via OPHTHALMIC

## 2019-10-14 MED ORDER — MOXIFLOXACIN HCL 0.5 % OP SOLN
OPHTHALMIC | Status: DC | PRN
  Administered 2019-10-14: 0.2 mL via OPHTHALMIC

## 2019-10-14 MED ORDER — MIDAZOLAM HCL 2 MG/2ML IJ SOLN
INTRAMUSCULAR | Status: DC | PRN
  Administered 2019-10-14: 2 mg via INTRAVENOUS

## 2019-10-14 MED ORDER — BRIMONIDINE TARTRATE-TIMOLOL 0.2-0.5 % OP SOLN
OPHTHALMIC | Status: DC | PRN
  Administered 2019-10-14: 1 [drp] via OPHTHALMIC

## 2019-10-14 MED ORDER — FENTANYL CITRATE (PF) 100 MCG/2ML IJ SOLN
INTRAMUSCULAR | Status: DC | PRN
  Administered 2019-10-14: 50 ug via INTRAVENOUS

## 2019-10-14 MED ORDER — LIDOCAINE HCL (PF) 2 % IJ SOLN
INTRAOCULAR | Status: DC | PRN
  Administered 2019-10-14: 1 mL

## 2019-10-14 MED ORDER — NA CHONDROIT SULF-NA HYALURON 40-17 MG/ML IO SOLN
INTRAOCULAR | Status: DC | PRN
  Administered 2019-10-14: 1 mL via INTRAOCULAR

## 2019-10-14 SURGICAL SUPPLY — 20 items
CANNULA ANT/CHMB 27G (MISCELLANEOUS) ×2 IMPLANT
CANNULA ANT/CHMB 27GA (MISCELLANEOUS) ×6 IMPLANT
GLOVE SURG LX 8.0 MICRO (GLOVE) ×2
GLOVE SURG LX STRL 8.0 MICRO (GLOVE) ×1 IMPLANT
GLOVE SURG TRIUMPH 8.0 PF LTX (GLOVE) ×3 IMPLANT
GOWN STRL REUS W/ TWL LRG LVL3 (GOWN DISPOSABLE) ×2 IMPLANT
GOWN STRL REUS W/TWL LRG LVL3 (GOWN DISPOSABLE) ×4
LENS IOL DIOP 20.0 (Intraocular Lens) ×3 IMPLANT
LENS IOL TECNIS MONO 20.0 (Intraocular Lens) IMPLANT
MARKER SKIN DUAL TIP RULER LAB (MISCELLANEOUS) ×3 IMPLANT
NDL FILTER BLUNT 18X1 1/2 (NEEDLE) ×1 IMPLANT
NEEDLE FILTER BLUNT 18X 1/2SAF (NEEDLE) ×2
NEEDLE FILTER BLUNT 18X1 1/2 (NEEDLE) ×1 IMPLANT
PACK EYE AFTER SURG (MISCELLANEOUS) ×3 IMPLANT
PACK OPTHALMIC (MISCELLANEOUS) ×3 IMPLANT
PACK PORFILIO (MISCELLANEOUS) ×3 IMPLANT
SYR 3ML LL SCALE MARK (SYRINGE) ×3 IMPLANT
SYR TB 1ML LUER SLIP (SYRINGE) ×3 IMPLANT
WATER STERILE IRR 250ML POUR (IV SOLUTION) ×3 IMPLANT
WIPE NON LINTING 3.25X3.25 (MISCELLANEOUS) ×3 IMPLANT

## 2019-10-14 NOTE — Transfer of Care (Signed)
Immediate Anesthesia Transfer of Care Note  Patient: Kerry Jefferson  Procedure(s) Performed: CATARACT EXTRACTION PHACO AND INTRAOCULAR LENS PLACEMENT (IOC) LEFT 6.76  00:37.1 (Left Eye)  Patient Location: PACU  Anesthesia Type: MAC  Level of Consciousness: awake, alert  and patient cooperative  Airway and Oxygen Therapy: Patient Spontanous Breathing and Patient connected to supplemental oxygen  Post-op Assessment: Post-op Vital signs reviewed, Patient's Cardiovascular Status Stable, Respiratory Function Stable, Patent Airway and No signs of Nausea or vomiting  Post-op Vital Signs: Reviewed and stable  Complications: No apparent anesthesia complications

## 2019-10-14 NOTE — Anesthesia Postprocedure Evaluation (Signed)
Anesthesia Post Note  Patient: Kerry Jefferson  Procedure(s) Performed: CATARACT EXTRACTION PHACO AND INTRAOCULAR LENS PLACEMENT (IOC) LEFT 6.76  00:37.1 (Left Eye)     Patient location during evaluation: PACU Anesthesia Type: MAC Level of consciousness: awake and alert Pain management: pain level controlled Vital Signs Assessment: post-procedure vital signs reviewed and stable Respiratory status: spontaneous breathing, nonlabored ventilation, respiratory function stable and patient connected to nasal cannula oxygen Cardiovascular status: stable and blood pressure returned to baseline Postop Assessment: no apparent nausea or vomiting Anesthetic complications: no    Jaquanna Ballentine A  Adlai Nieblas

## 2019-10-14 NOTE — Anesthesia Preprocedure Evaluation (Signed)
Anesthesia Evaluation  Patient identified by MRN, date of birth, ID band Patient awake    Reviewed: Allergy & Precautions, NPO status , Patient's Chart, lab work & pertinent test results, reviewed documented beta blocker date and time   History of Anesthesia Complications Negative for: history of anesthetic complications  Airway Mallampati: I  TM Distance: >3 FB Neck ROM: full    Dental no notable dental hx.    Pulmonary former smoker,    Pulmonary exam normal breath sounds clear to auscultation       Cardiovascular Exercise Tolerance: Good hypertension, (-) angina(-) DOE Normal cardiovascular exam Rhythm:Regular Rate:Normal   HLD   Neuro/Psych  Headaches,    GI/Hepatic GERD  Controlled,(+) Hepatitis - (10 years ago from antiobiotics)Colitis: 08/2018   Endo/Other   Endometriosis  Renal/GU      Musculoskeletal  (+) Arthritis ,   Abdominal (+) + obese (BMI 30),   Peds  Hematology   Anesthesia Other Findings Skin cancer  Reproductive/Obstetrics                             Anesthesia Physical  Anesthesia Plan  ASA: II  Anesthesia Plan: MAC   Post-op Pain Management:    Induction: Intravenous  PONV Risk Score and Plan: 2 and TIVA, Midazolam and Treatment may vary due to age or medical condition  Airway Management Planned: Nasal Cannula  Additional Equipment:   Intra-op Plan:   Post-operative Plan:   Informed Consent: I have reviewed the patients History and Physical, chart, labs and discussed the procedure including the risks, benefits and alternatives for the proposed anesthesia with the patient or authorized representative who has indicated his/her understanding and acceptance.       Plan Discussed with: CRNA  Anesthesia Plan Comments:         Anesthesia Quick Evaluation

## 2019-10-14 NOTE — H&P (Signed)
All labs reviewed. Abnormal studies sent to patients PCP when indicated.  Previous H&P reviewed, patient examined, there are NO CHANGES.  Kerry Subia Porfilio5/25/20219:55 AM

## 2019-10-14 NOTE — Anesthesia Procedure Notes (Signed)
Procedure Name: MAC Performed by: Naoko Diperna, CRNA Pre-anesthesia Checklist: Patient identified, Emergency Drugs available, Suction available, Timeout performed and Patient being monitored Patient Re-evaluated:Patient Re-evaluated prior to induction Oxygen Delivery Method: Nasal cannula Placement Confirmation: positive ETCO2       

## 2019-10-14 NOTE — Op Note (Signed)
PREOPERATIVE DIAGNOSIS:  Nuclear sclerotic cataract of the left eye.   POSTOPERATIVE DIAGNOSIS:  Nuclear sclerotic cataract of the left eye.   OPERATIVE PROCEDURE:@   SURGEON:  Birder Robson, MD.   ANESTHESIA:  Anesthesiologist: Heniser, Fredric Dine, MD CRNA: Mayme Genta, CRNA  1.      Managed anesthesia care. 2.     0.5m of Shugarcaine was instilled following the paracentesis   COMPLICATIONS:  None.   TECHNIQUE:   Stop and chop   DESCRIPTION OF PROCEDURE:  The patient was examined and consented in the preoperative holding area where the aforementioned topical anesthesia was applied to the left eye and then brought back to the Operating Room where the left eye was prepped and draped in the usual sterile ophthalmic fashion and a lid speculum was placed. A paracentesis was created with the side port blade and the anterior chamber was filled with viscoelastic. A near clear corneal incision was performed with the steel keratome. A continuous curvilinear capsulorrhexis was performed with a cystotome followed by the capsulorrhexis forceps. Hydrodissection and hydrodelineation were carried out with BSS on a blunt cannula. The lens was removed in a stop and chop  technique and the remaining cortical material was removed with the irrigation-aspiration handpiece. The capsular bag was inflated with viscoelastic and the Technis [id number] lens was placed in the capsular bag without complication. The remaining viscoelastic was removed from the eye with the irrigation-aspiration handpiece. The wounds were hydrated. The anterior chamber was flushed with BSS and the eye was inflated to physiologic pressure. 0.141mVigamox was placed in the anterior chamber. The wounds were found to be water tight. The eye was dressed with Combigan. The patient was given protective glasses to wear throughout the day and a shield with which to sleep tonight. The patient was also given drops with which to begin a drop regimen today  and will follow-up with me in one day. Implant Name Type Inv. Item Serial No. Manufacturer Lot No. LRB No. Used Action  LENS IOL DIOP 20.0 - [id number] Lens LENS IOL DIOP 20.0 [id number]  Left 1 Implanted    Procedure(s): CATARACT EXTRACTION PHACO AND INTRAOCULAR LENS PLACEMENT (IOC) LEFT 6.76  00:37.1 (Left)  Electronically signed: WiBirder Robson/25/2021 10:24 AM

## 2019-10-15 ENCOUNTER — Encounter: Payer: Self-pay | Admitting: *Deleted

## 2019-11-12 ENCOUNTER — Other Ambulatory Visit: Payer: Self-pay | Admitting: Internal Medicine

## 2019-11-12 DIAGNOSIS — Z1231 Encounter for screening mammogram for malignant neoplasm of breast: Secondary | ICD-10-CM

## 2019-12-12 ENCOUNTER — Other Ambulatory Visit: Payer: Self-pay | Admitting: Internal Medicine

## 2019-12-12 DIAGNOSIS — K529 Noninfective gastroenteritis and colitis, unspecified: Secondary | ICD-10-CM

## 2019-12-18 ENCOUNTER — Ambulatory Visit
Admission: RE | Admit: 2019-12-18 | Discharge: 2019-12-18 | Disposition: A | Discharge status: Home / Self Care | Payer: Medicare Other | Source: Ambulatory Visit | Attending: Internal Medicine | Admitting: Internal Medicine

## 2019-12-18 DIAGNOSIS — Z1231 Encounter for screening mammogram for malignant neoplasm of breast: Secondary | ICD-10-CM | POA: Diagnosis not present

## 2019-12-23 ENCOUNTER — Other Ambulatory Visit: Payer: Self-pay

## 2019-12-23 ENCOUNTER — Ambulatory Visit
Admission: RE | Admit: 2019-12-23 | Discharge: 2019-12-23 | Disposition: A | Discharge status: Home / Self Care | Payer: Medicare Other | Source: Ambulatory Visit | Attending: Internal Medicine | Admitting: Internal Medicine

## 2019-12-23 DIAGNOSIS — K529 Noninfective gastroenteritis and colitis, unspecified: Secondary | ICD-10-CM | POA: Diagnosis not present

## 2019-12-23 MED ORDER — IOHEXOL 300 MG/ML  SOLN
100.0000 mL | Freq: Once | INTRAMUSCULAR | Status: AC | PRN
  Administered 2019-12-23: 100 mL via INTRAVENOUS

## 2019-12-24 ENCOUNTER — Other Ambulatory Visit: Payer: Self-pay | Admitting: Internal Medicine

## 2019-12-24 DIAGNOSIS — K8689 Other specified diseases of pancreas: Secondary | ICD-10-CM

## 2020-01-08 ENCOUNTER — Ambulatory Visit
Admission: RE | Admit: 2020-01-08 | Discharge: 2020-01-08 | Disposition: A | Discharge status: Home / Self Care | Payer: Medicare Other | Source: Ambulatory Visit | Attending: Internal Medicine | Admitting: Internal Medicine

## 2020-01-08 ENCOUNTER — Other Ambulatory Visit: Payer: Self-pay

## 2020-01-08 DIAGNOSIS — K8689 Other specified diseases of pancreas: Secondary | ICD-10-CM | POA: Insufficient documentation

## 2020-01-08 MED ORDER — GADOBUTROL 1 MMOL/ML IV SOLN
8.0000 mL | Freq: Once | INTRAVENOUS | Status: AC | PRN
  Administered 2020-01-08: 8 mL via INTRAVENOUS

## 2020-04-23 ENCOUNTER — Other Ambulatory Visit: Payer: Self-pay | Admitting: Internal Medicine

## 2020-04-23 DIAGNOSIS — R42 Dizziness and giddiness: Secondary | ICD-10-CM

## 2020-04-30 ENCOUNTER — Ambulatory Visit: Payer: Medicare Other

## 2020-05-02 ENCOUNTER — Encounter: Payer: Self-pay | Admitting: *Deleted

## 2020-05-02 ENCOUNTER — Other Ambulatory Visit: Payer: Self-pay

## 2020-05-02 ENCOUNTER — Emergency Department
Admission: EM | Admit: 2020-05-02 | Discharge: 2020-05-02 | Disposition: A | Discharge status: Home / Self Care | Payer: Medicare Other | Attending: Emergency Medicine | Admitting: Emergency Medicine

## 2020-05-02 DIAGNOSIS — Z87891 Personal history of nicotine dependence: Secondary | ICD-10-CM | POA: Insufficient documentation

## 2020-05-02 DIAGNOSIS — Z85828 Personal history of other malignant neoplasm of skin: Secondary | ICD-10-CM | POA: Diagnosis not present

## 2020-05-02 DIAGNOSIS — Z86010 Personal history of colonic polyps: Secondary | ICD-10-CM | POA: Insufficient documentation

## 2020-05-02 DIAGNOSIS — Z79899 Other long term (current) drug therapy: Secondary | ICD-10-CM | POA: Diagnosis not present

## 2020-05-02 DIAGNOSIS — I10 Essential (primary) hypertension: Secondary | ICD-10-CM | POA: Insufficient documentation

## 2020-05-02 DIAGNOSIS — M79661 Pain in right lower leg: Secondary | ICD-10-CM | POA: Diagnosis present

## 2020-05-02 DIAGNOSIS — M79662 Pain in left lower leg: Secondary | ICD-10-CM | POA: Diagnosis not present

## 2020-05-02 DIAGNOSIS — M79604 Pain in right leg: Secondary | ICD-10-CM

## 2020-05-02 LAB — TROPONIN I (HIGH SENSITIVITY): Troponin I (High Sensitivity): 5 ng/L (ref <18)

## 2020-05-02 LAB — CBC WITH DIFFERENTIAL/PLATELET
Abs Immature Granulocytes: 0.02 10*3/uL (ref 0.00–0.07)
Basophils Absolute: 0 10*3/uL (ref 0.0–0.1)
Basophils Relative: 1 %
Eosinophils Absolute: 0.1 10*3/uL (ref 0.0–0.5)
Eosinophils Relative: 1 %
HCT: 39.8 % (ref 36.0–46.0)
Hemoglobin: 13.6 g/dL (ref 12.0–15.0)
Immature Granulocytes: 0 %
Lymphocytes Relative: 14 %
Lymphs Abs: 1.2 10*3/uL (ref 0.7–4.0)
MCH: 28.7 pg (ref 26.0–34.0)
MCHC: 34.2 g/dL (ref 30.0–36.0)
MCV: 84 fL (ref 80.0–100.0)
Monocytes Absolute: 0.6 10*3/uL (ref 0.1–1.0)
Monocytes Relative: 7 %
Neutro Abs: 6.6 10*3/uL (ref 1.7–7.7)
Neutrophils Relative %: 77 %
Platelets: 192 10*3/uL (ref 150–400)
RBC: 4.74 MIL/uL (ref 3.87–5.11)
RDW: 13.7 % (ref 11.5–15.5)
WBC: 8.6 10*3/uL (ref 4.0–10.5)
nRBC: 0 % (ref 0.0–0.2)

## 2020-05-02 LAB — COMPREHENSIVE METABOLIC PANEL
ALT: 15 U/L (ref 0–44)
AST: 18 U/L (ref 15–41)
Albumin: 4.4 g/dL (ref 3.5–5.0)
Alkaline Phosphatase: 55 U/L (ref 38–126)
Anion gap: 9 (ref 5–15)
BUN: 13 mg/dL (ref 8–23)
CO2: 29 mmol/L (ref 22–32)
Calcium: 9.1 mg/dL (ref 8.9–10.3)
Chloride: 92 mmol/L — ABNORMAL LOW (ref 98–111)
Creatinine, Ser: 0.84 mg/dL (ref 0.44–1.00)
GFR, Estimated: >60 mL/min (ref >60)
Glucose, Bld: 98 mg/dL (ref 70–99)
Potassium: 3.4 mmol/L — ABNORMAL LOW (ref 3.5–5.1)
Sodium: 130 mmol/L — ABNORMAL LOW (ref 135–145)
Total Bilirubin: 0.9 mg/dL (ref 0.3–1.2)
Total Protein: 6.9 g/dL (ref 6.5–8.1)

## 2020-05-02 LAB — CK: Total CK: 69 U/L (ref 38–234)

## 2020-05-02 NOTE — ED Provider Notes (Signed)
[facility] Emergency Department Provider Note   ____________________________________________   Event Date/Time   First MD Initiated Contact with Patient 05/02/20 0430     (approximate)  I have reviewed the triage vital signs and the nursing notes.   HISTORY  Chief Complaint Foot Pain    HPI Kerry Jefferson is a 74 y.o. female with a stated past medical history of hypertension, hyperlipidemia, and GERD who presents for bilateral calf pain.  Patient states that for the past 3 weeks she has had intermittent but daily calf burning pain that she believes is brought on by starting amlodipine.  Patient states that this Pain started whenever she had her first dose of amlodipine and resolved when she stopped it for a couple days.  Patient states that after she started taking it again 3-4 days ago the symptoms recurred.  Of note patient states that she was told to stop her statin as there was concerns for possible myolysis causing this pain.  Patient denies any other exacerbating or relieving factors.         Past Medical History:  Diagnosis Date  . Arthritis    hands, feet  . Cancer ([place])    skin  . GERD (gastroesophageal reflux disease)   . Hepatitis    "8-10 yrs ago". "brought on by antibiotics"  . Hyperlipidemia   . Hypertension   . Tendinitis   . Vertigo    1x/month    Patient Active Problem List   Diagnosis Date Noted  . AKI (acute kidney injury) ([place]) 09/01/2019  . Hypokalemia 09/01/2019  . Colitis 08/31/2019  . Heart palpitations 12/25/2017  . Allergic rhinitis 08/20/2015  . Colon polyp 08/20/2015  . Cyst of right ovary 08/20/2015  . Endometriosis 08/20/2015  . Acid reflux 08/20/2015  . History of colitis 08/20/2015  . H/O viral illness 08/20/2015  . Osteopenia 08/20/2015  . 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 HLD (hyperlipidemia) 07/21/2015  . APC (atrial premature contractions) 07/21/2014  . Essential (primary) hypertension  02/10/2014  . Facial numbness 12/30/2013  . Jerking 12/30/2013  . Arthritis, degenerative 11/17/2013  . Benign essential HTN 11/10/2013  . Cephalalgia 11/10/2013  . Pure hypercholesterolemia 11/10/2013  . Feeling bilious 07/26/2011    Past Surgical History:  Procedure Laterality Date  . ABDOMINAL HYSTERECTOMY    . BREAST BIOPSY Left 2003   negative  . CATARACT EXTRACTION W/PHACO Right 09/23/2019   Procedure: CATARACT EXTRACTION PHACO AND INTRAOCULAR LENS PLACEMENT (IOC) RIGHT 4.61  00:29.6;  Surgeon: Birder Robson, MD;  Location: [place];  Service: Ophthalmology;  Laterality: Right;  . CATARACT EXTRACTION W/PHACO Left 10/14/2019   Procedure: CATARACT EXTRACTION PHACO AND INTRAOCULAR LENS PLACEMENT (IOC) LEFT 6.76  00:37.1;  Surgeon: Birder Robson, MD;  Location: [place];  Service: Ophthalmology;  Laterality: Left;  . CHOLECYSTECTOMY    . COLONOSCOPY WITH PROPOFOL N/A 10/22/2015   Procedure: COLONOSCOPY WITH PROPOFOL;  Surgeon: Manya Silvas, MD;  Location: [facility] ENDOSCOPY;  Service: Endoscopy;  Laterality: N/A;  . COLONOSCOPY WITH PROPOFOL N/A 09/02/2019   Procedure: COLONOSCOPY WITH PROPOFOL;  Surgeon: Lin Landsman, MD;  Location[facility] ENDOSCOPY;  Service: Gastroenterology;  Laterality: N/A;  . TONSILLECTOMY      Prior to Admission medications   Medication Sig Start Date End Date Taking? Authorizing Provider  cholecalciferol (VITAMIN D) 25 MCG (1000 UNIT) tablet Take 2,000 Units by mouth daily.    [provider]  Coenzyme Q10 (COQ-10 PO) Take 1 tablet by mouth  daily.    [provider]  desonide (DESOWEN) 0.05 % cream  08/25/19   [provider]  escitalopram (LEXAPRO) 10 MG tablet Take 1 tablet by mouth daily. 08/12/18   [provider]  fluticasone (FLONASE) 50 MCG/ACT nasal spray USE TWO SPRAY(S) IN EACH NOSTRIL ONCE DAILY. 06/07/18   [provider]  folic acid (FOLVITE) 400 MCG tablet Take by mouth.     [provider]  ketoconazole (NIZORAL) 2 % cream  08/25/19   [provider]  olmesartan-hydrochlorothiazide (BENICAR HCT) 40-12.5 MG tablet Take 1 tablet by mouth daily. 07/30/19   [provider]  omeprazole (PRILOSEC) 40 MG capsule Take 40 mg by mouth daily as needed.    [provider]  simvastatin (ZOCOR) 80 MG tablet Take 40 mg by mouth daily at 6 PM.  04/07/13   [provider]  TOPROL XL 100 MG 24 hr tablet Take 100 mg by mouth daily.  03/04/13   [provider]  traZODone (DESYREL) 50 MG tablet Take 50 mg by mouth at bedtime as needed.  04/25/18   [provider]    Allergies Atenolol, Ciprofloxacin, Ciprofloxacin hcl, Lisinopril, Moxifloxacin, and Sertraline  Family History  Problem Relation Age of Onset  . Breast cancer Maternal Grandmother 80    Social History Social History   Tobacco Use  . Smoking status: Former Smoker    Quit date: 2003    Years since quitting: 18.9  . Smokeless tobacco: Never Used  . Tobacco comment: quit 10 years ago  Vaping Use  . Vaping Use: Never used  Substance Use Topics  . Alcohol use: Yes    Comment: occ.  . Drug use: No    Review of Systems Constitutional: No fever/chills Eyes: No visual changes. ENT: No sore throat. Cardiovascular: Denies chest pain. Respiratory: Denies shortness of breath. Gastrointestinal: No abdominal pain.  No nausea, no vomiting.  No diarrhea. Genitourinary: Negative for dysuria. Musculoskeletal: Positive for bilateral calf pain Skin: Negative for rash. Neurological: Negative for headaches, weakness/numbness/paresthesias in any extremity Psychiatric: Negative for suicidal ideation/homicidal ideation   ____________________________________________   PHYSICAL EXAM:  VITAL SIGNS: ED Triage Vitals [05/02/20 0426]  Enc Vitals Group     BP (!) 182/81     Pulse Rate 77     Resp 16     Temp 98.7 F (37.1 C)     Temp Source Oral     SpO2 99  %     Weight 179 lb 14.3 oz (81.6 kg)     Height 5' 5"  (1.651 m)     Head Circumference      Peak Flow      Pain Score 4     Pain Loc      Pain Edu?      Excl. in [place]?    Constitutional: Alert and oriented. Well appearing and in no acute distress. Eyes: Conjunctivae are normal. PERRL. Head: Atraumatic. Nose: No congestion/rhinnorhea. Mouth/Throat: Mucous membranes are moist. Neck: No stridor Cardiovascular: Grossly normal heart sounds.  Good peripheral circulation. Respiratory: Normal respiratory effort.  No retractions. Gastrointestinal: Soft and nontender. No distention. Musculoskeletal: No obvious deformities Neurologic:  Normal speech and language. No gross focal neurologic deficits are appreciated. Skin:  Skin is warm and dry. No rash noted. Psychiatric: Mood and affect are normal. Speech and behavior are normal.  ____________________________________________   LABS (all labs ordered are listed, but only abnormal results are displayed)  Labs Reviewed  COMPREHENSIVE METABOLIC PANEL -  Abnormal; Notable for the following components:      Result Value   Sodium 130 (*)    Potassium 3.4 (*)    Chloride 92 (*)    All other components within normal limits  CBC WITH DIFFERENTIAL/PLATELET  CK  TROPONIN I (HIGH SENSITIVITY)   ____________________________________________  EKG  ED ECG REPORT I, Naaman Plummer, the attending physician, personally viewed and interpreted this ECG.  Date: 05/02/2020 EKG Time: 0507 Rate: 58 Rhythm: normal sinus rhythm QRS Axis: normal Intervals: normal ST/T Wave abnormalities: normal Narrative Interpretation: no evidence of acute ischemia  PROCEDURES  Procedure(s) performed (including Critical Care):  Procedures   ____________________________________________   INITIAL IMPRESSION / ASSESSMENT AND PLAN / ED COURSE  As part of my medical decision making, I reviewed the following data within the [place]  notes reviewed and incorporated, Labs reviewed, EKG interpreted, Old chart reviewed, Radiograph reviewed and Notes from prior ED visits reviewed and incorporated        Patient is a 74 year old female who presents for bilateral calf burning pain after starting amlodipine Given history, exam and workup I have low suspicion for fracture, dislocation, significant ligamentous injury, septic arthritis, gout flare, new autoimmune arthropathy, or gonococcal arthropathy.  Interventions: None necessary  Patient counseled on stopping her amlodipine and following up with primary care provider in the next 1-3 days for further evaluation management. Disposition: Discharge home with strict return precautions and instructions for prompt primary care follow up in the next week.      ____________________________________________   FINAL CLINICAL IMPRESSION(S) / ED DIAGNOSES  Final diagnoses:  Bilateral lower extremity pain     ED Discharge Orders    None       Note:  This document was prepared using Dragon voice recognition software and may include unintentional dictation errors.   Naaman Plummer, MD 05/03/20 [phone]

## 2020-05-02 NOTE — ED Triage Notes (Signed)
Pt to ED reporting concern for a medication interaction. Pt reports having burning in both her legs running up her calves with chills x 3-4 days. Pts PCP has been changing BP medications and she has concerns that they are interacting.

## 2020-05-12 ENCOUNTER — Other Ambulatory Visit: Payer: Self-pay

## 2020-05-12 ENCOUNTER — Ambulatory Visit
Admission: RE | Admit: 2020-05-12 | Discharge: 2020-05-12 | Disposition: A | Discharge status: Home / Self Care | Payer: Medicare Other | Source: Ambulatory Visit | Attending: Internal Medicine | Admitting: Internal Medicine

## 2020-05-12 DIAGNOSIS — R42 Dizziness and giddiness: Secondary | ICD-10-CM | POA: Diagnosis present

## 2020-07-11 ENCOUNTER — Other Ambulatory Visit: Payer: Self-pay

## 2020-07-11 ENCOUNTER — Emergency Department: Payer: Medicare Other

## 2020-07-11 ENCOUNTER — Encounter: Payer: Self-pay | Admitting: Intensive Care

## 2020-07-11 ENCOUNTER — Observation Stay
Admission: EM | Admit: 2020-07-11 | Discharge: 2020-07-12 | Disposition: A | Discharge status: Home / Self Care | Payer: Medicare Other | Attending: Internal Medicine | Admitting: Internal Medicine

## 2020-07-11 DIAGNOSIS — Z79899 Other long term (current) drug therapy: Secondary | ICD-10-CM | POA: Diagnosis not present

## 2020-07-11 DIAGNOSIS — K921 Melena: Secondary | ICD-10-CM | POA: Insufficient documentation

## 2020-07-11 DIAGNOSIS — E785 Hyperlipidemia, unspecified: Secondary | ICD-10-CM

## 2020-07-11 DIAGNOSIS — K5792 Diverticulitis of intestine, part unspecified, without perforation or abscess without bleeding: Principal | ICD-10-CM

## 2020-07-11 DIAGNOSIS — E782 Mixed hyperlipidemia: Secondary | ICD-10-CM | POA: Diagnosis present

## 2020-07-11 DIAGNOSIS — R1084 Generalized abdominal pain: Secondary | ICD-10-CM | POA: Diagnosis present

## 2020-07-11 DIAGNOSIS — I10 Essential (primary) hypertension: Secondary | ICD-10-CM | POA: Diagnosis not present

## 2020-07-11 DIAGNOSIS — Z85828 Personal history of other malignant neoplasm of skin: Secondary | ICD-10-CM | POA: Diagnosis not present

## 2020-07-11 DIAGNOSIS — Z20822 Contact with and (suspected) exposure to covid-19: Secondary | ICD-10-CM | POA: Diagnosis not present

## 2020-07-11 DIAGNOSIS — R197 Diarrhea, unspecified: Secondary | ICD-10-CM

## 2020-07-11 DIAGNOSIS — Z87891 Personal history of nicotine dependence: Secondary | ICD-10-CM | POA: Diagnosis not present

## 2020-07-11 DIAGNOSIS — Z86010 Personal history of colonic polyps: Secondary | ICD-10-CM | POA: Insufficient documentation

## 2020-07-11 DIAGNOSIS — E7800 Pure hypercholesterolemia, unspecified: Secondary | ICD-10-CM | POA: Diagnosis present

## 2020-07-11 LAB — CBC
HCT: 41.9 % (ref 36.0–46.0)
Hemoglobin: 13.7 g/dL (ref 12.0–15.0)
MCH: 28.8 pg (ref 26.0–34.0)
MCHC: 32.7 g/dL (ref 30.0–36.0)
MCV: 88.2 fL (ref 80.0–100.0)
Platelets: 215 10*3/uL (ref 150–400)
RBC: 4.75 MIL/uL (ref 3.87–5.11)
RDW: 14 % (ref 11.5–15.5)
WBC: 12.1 10*3/uL — ABNORMAL HIGH (ref 4.0–10.5)
nRBC: 0 % (ref 0.0–0.2)

## 2020-07-11 LAB — URINALYSIS, COMPLETE (UACMP) WITH MICROSCOPIC
Bacteria, UA: NONE SEEN
Bilirubin Urine: NEGATIVE
Glucose, UA: NEGATIVE mg/dL
Hgb urine dipstick: NEGATIVE
Ketones, ur: NEGATIVE mg/dL
Nitrite: NEGATIVE
Protein, ur: NEGATIVE mg/dL
Specific Gravity, Urine: 1.021 (ref 1.005–1.030)
pH: 6 (ref 5.0–8.0)

## 2020-07-11 LAB — GASTROINTESTINAL PANEL BY PCR, STOOL (REPLACES STOOL CULTURE)

## 2020-07-11 LAB — C DIFFICILE QUICK SCREEN W PCR REFLEX
C Diff antigen: NEGATIVE
C Diff interpretation: NOT DETECTED
C Diff toxin: NEGATIVE

## 2020-07-11 LAB — LIPASE, BLOOD: Lipase: 65 U/L — ABNORMAL HIGH (ref 11–51)

## 2020-07-11 LAB — HEMOGLOBIN AND HEMATOCRIT, BLOOD
HCT: 36.2 % (ref 36.0–46.0)
Hemoglobin: 12 g/dL (ref 12.0–15.0)

## 2020-07-11 LAB — COMPREHENSIVE METABOLIC PANEL
ALT: 16 U/L (ref 0–44)
AST: 19 U/L (ref 15–41)
Albumin: 4.2 g/dL (ref 3.5–5.0)
Alkaline Phosphatase: 60 U/L (ref 38–126)
Anion gap: 10 (ref 5–15)
BUN: 13 mg/dL (ref 8–23)
CO2: 29 mmol/L (ref 22–32)
Calcium: 9.2 mg/dL (ref 8.9–10.3)
Chloride: 97 mmol/L — ABNORMAL LOW (ref 98–111)
Creatinine, Ser: 0.81 mg/dL (ref 0.44–1.00)
GFR, Estimated: >60 mL/min (ref >60)
Glucose, Bld: 116 mg/dL — ABNORMAL HIGH (ref 70–99)
Potassium: 3.8 mmol/L (ref 3.5–5.1)
Sodium: 136 mmol/L (ref 135–145)
Total Bilirubin: 0.6 mg/dL (ref 0.3–1.2)
Total Protein: 7 g/dL (ref 6.5–8.1)

## 2020-07-11 LAB — SARS CORONAVIRUS 2 (TAT 6-24 HRS): SARS Coronavirus 2: NEGATIVE

## 2020-07-11 MED ORDER — FLUTICASONE PROPIONATE 50 MCG/ACT NA SUSP
1.0000 | Freq: Every day | NASAL | Status: DC
  Administered 2020-07-12: 09:00:00 1 via NASAL
  Filled 2020-07-11: qty 16

## 2020-07-11 MED ORDER — METRONIDAZOLE IN NACL 5-0.79 MG/ML-% IV SOLN
500.0000 mg | Freq: Once | INTRAVENOUS | Status: AC
  Administered 2020-07-11: 500 mg via INTRAVENOUS
  Filled 2020-07-11: qty 100

## 2020-07-11 MED ORDER — SODIUM CHLORIDE 0.9 % IV SOLN: INTRAVENOUS | Status: DC

## 2020-07-11 MED ORDER — HYDROCHLOROTHIAZIDE 12.5 MG PO CAPS
12.5000 mg | ORAL_CAPSULE | Freq: Every day | ORAL | Status: DC
  Administered 2020-07-12: 09:00:00 12.5 mg via ORAL
  Filled 2020-07-11: qty 1

## 2020-07-11 MED ORDER — OLMESARTAN MEDOXOMIL-HCTZ 40-12.5 MG PO TABS: 1.0000 | ORAL_TABLET | Freq: Every day | ORAL | Status: DC

## 2020-07-11 MED ORDER — ONDANSETRON HCL 4 MG PO TABS: 4.0000 mg | ORAL_TABLET | Freq: Four times a day (QID) | ORAL | Status: DC | PRN

## 2020-07-11 MED ORDER — ESCITALOPRAM OXALATE 10 MG PO TABS
10.0000 mg | ORAL_TABLET | Freq: Every day | ORAL | Status: DC
  Administered 2020-07-12: 09:00:00 10 mg via ORAL
  Filled 2020-07-11 (×2): qty 1

## 2020-07-11 MED ORDER — FOLIC ACID 1 MG PO TABS
500.0000 ug | ORAL_TABLET | Freq: Every day | ORAL | Status: DC
  Administered 2020-07-11 – 2020-07-12 (×2): 0.5 mg via ORAL
  Filled 2020-07-11 (×2): qty 1

## 2020-07-11 MED ORDER — SODIUM CHLORIDE 0.9% FLUSH: 3.0000 mL | INTRAVENOUS | Status: DC | PRN

## 2020-07-11 MED ORDER — PIPERACILLIN-TAZOBACTAM 3.375 G IVPB
3.3750 g | Freq: Three times a day (TID) | INTRAVENOUS | Status: DC
  Administered 2020-07-11: 3.375 g via INTRAVENOUS
  Filled 2020-07-11: qty 50

## 2020-07-11 MED ORDER — SODIUM CHLORIDE 0.9% FLUSH
3.0000 mL | Freq: Two times a day (BID) | INTRAVENOUS | Status: DC
  Administered 2020-07-11: 3 mL via INTRAVENOUS

## 2020-07-11 MED ORDER — METOPROLOL SUCCINATE ER 50 MG PO TB24
100.0000 mg | ORAL_TABLET | Freq: Every day | ORAL | Status: DC
  Administered 2020-07-11: 100 mg via ORAL
  Filled 2020-07-11 (×2): qty 2

## 2020-07-11 MED ORDER — IRBESARTAN 150 MG PO TABS
300.0000 mg | ORAL_TABLET | Freq: Every day | ORAL | Status: DC
  Administered 2020-07-12: 300 mg via ORAL
  Filled 2020-07-11 (×2): qty 2

## 2020-07-11 MED ORDER — VITAMIN D 25 MCG (1000 UNIT) PO TABS
2000.0000 [IU] | ORAL_TABLET | Freq: Every day | ORAL | Status: DC
  Administered 2020-07-11 – 2020-07-12 (×2): 2000 [IU] via ORAL
  Filled 2020-07-11 (×2): qty 2

## 2020-07-11 MED ORDER — SODIUM CHLORIDE 0.45 % IV SOLN: INTRAVENOUS | Status: DC

## 2020-07-11 MED ORDER — CLONIDINE HCL 0.1 MG PO TABS
0.1000 mg | ORAL_TABLET | Freq: Once | ORAL | Status: AC
  Administered 2020-07-11: 0.1 mg via ORAL
  Filled 2020-07-11: qty 1

## 2020-07-11 MED ORDER — SODIUM CHLORIDE 0.9 % IV BOLUS
1000.0000 mL | Freq: Once | INTRAVENOUS | Status: AC
  Administered 2020-07-11: 1000 mL via INTRAVENOUS

## 2020-07-11 MED ORDER — SODIUM CHLORIDE 0.9 % IV SOLN: 250.0000 mL | INTRAVENOUS | Status: DC | PRN

## 2020-07-11 MED ORDER — TRAZODONE HCL 50 MG PO TABS
150.0000 mg | ORAL_TABLET | Freq: Every evening | ORAL | Status: DC | PRN
  Administered 2020-07-11: 150 mg via ORAL
  Filled 2020-07-11: qty 3

## 2020-07-11 MED ORDER — ONDANSETRON HCL 4 MG/2ML IJ SOLN: 4.0000 mg | Freq: Four times a day (QID) | INTRAMUSCULAR | Status: DC | PRN

## 2020-07-11 MED ORDER — SODIUM CHLORIDE 0.9 % IV SOLN
1.0000 g | Freq: Once | INTRAVENOUS | Status: AC
  Administered 2020-07-11: 1 g via INTRAVENOUS
  Filled 2020-07-11: qty 10

## 2020-07-11 MED ORDER — ACETAMINOPHEN 325 MG PO TABS
650.0000 mg | ORAL_TABLET | Freq: Four times a day (QID) | ORAL | Status: DC | PRN
  Administered 2020-07-11: 650 mg via ORAL
  Filled 2020-07-11: qty 2

## 2020-07-11 MED ORDER — TRAZODONE HCL 50 MG PO TABS: 50.0000 mg | ORAL_TABLET | Freq: Every evening | ORAL | Status: DC | PRN

## 2020-07-11 MED ORDER — PANTOPRAZOLE SODIUM 40 MG PO TBEC
40.0000 mg | DELAYED_RELEASE_TABLET | Freq: Every day | ORAL | Status: DC
  Administered 2020-07-11 – 2020-07-12 (×2): 40 mg via ORAL
  Filled 2020-07-11 (×2): qty 1

## 2020-07-11 MED ORDER — IOHEXOL 300 MG/ML  SOLN
100.0000 mL | Freq: Once | INTRAMUSCULAR | Status: AC | PRN
  Administered 2020-07-11: 100 mL via INTRAVENOUS

## 2020-07-11 MED ORDER — ATORVASTATIN CALCIUM 20 MG PO TABS
40.0000 mg | ORAL_TABLET | Freq: Every day | ORAL | Status: DC
  Administered 2020-07-11 – 2020-07-12 (×2): 40 mg via ORAL
  Filled 2020-07-11 (×2): qty 2

## 2020-07-11 NOTE — ED Notes (Signed)
RN Alric Ran on floor messaged report about patient. Awaiting response

## 2020-07-11 NOTE — ED Notes (Signed)
Pt to CT. CT will place IV.

## 2020-07-11 NOTE — Plan of Care (Signed)

## 2020-07-11 NOTE — Consult Note (Signed)
[facility] GI Inpatient Consult Note   Kathline Magic, M.D.  Reason for Consult: Acute diverticulitis with rectal bleeding   Attending Requesting Consult: Collier Bullock, M.D.   History of Present Illness: Kerry Jefferson is a 75 y.o. female presenting this AM to the ED for acute abdominal pain, diarrhea following by blood with the diarrhea. She is followed at [facility] by Octavia Bruckner, PA-C. 11/11/2018 - Dr. Alice Reichert - Multiple left-sided diverticuli and few right colon diverticuli; one solitary tubular adenoma removed. 09/02/19 Inpatient Colonoscopy by Dr. Marius Ditch showed pancolonic diverticulosis, left more severe than the right. Biopsies in taken in a congested segment of sigmoid was worrisome for possible ischemic colitis.  Patient history very consistent with ischemic colitis given acute onset of abdominal cramping at 3:30am today followed by several loose, brown stools. As diarrhea continued, however, stool became more watery and had fresh red blood mixed in. After several hours in the ED, however, stools are becoming less frequent, although still contain some blood. Abdominal pain is almost resolved after Iv hydration. Patient underwent CT with findings consistent with acute colitis, possibly diverticulitis. Patient was given a dose of Zosyn and admitted by the hospitalist service.  Past Medical History:  Past Medical History:  Diagnosis Date  . Arthritis    hands, feet  . Cancer ([place])    skin  . GERD (gastroesophageal reflux disease)   . Hepatitis    "8-10 yrs ago". "brought on by antibiotics"  . Hyperlipidemia   . Hypertension   . Tendinitis   . Vertigo    1x/month    Problem List: Patient Active Problem List   Diagnosis Date Noted  . Acute diverticulitis 07/11/2020  . AKI (acute kidney injury) ([place]) 09/01/2019  . Hypokalemia 09/01/2019  . Colitis 08/31/2019  . Heart palpitations 12/25/2017  . Allergic rhinitis 08/20/2015  . Colon polyp 08/20/2015  .  Cyst of right ovary 08/20/2015  . Endometriosis 08/20/2015  . Acid reflux 08/20/2015  . History of colitis 08/20/2015  . H/O viral illness 08/20/2015  . Osteopenia 08/20/2015  . 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 HLD (hyperlipidemia) 07/21/2015  . APC (atrial premature contractions) 07/21/2014  . Essential (primary) hypertension 02/10/2014  . Facial numbness 12/30/2013  . Jerking 12/30/2013  . Arthritis, degenerative 11/17/2013  . Benign essential HTN 11/10/2013  . Cephalalgia 11/10/2013  . Feeling bilious 07/26/2011    Past Surgical History: Past Surgical History:  Procedure Laterality Date  . ABDOMINAL HYSTERECTOMY    . BREAST BIOPSY Left 2003   negative  . CATARACT EXTRACTION W/PHACO Right 09/23/2019   Procedure: CATARACT EXTRACTION PHACO AND INTRAOCULAR LENS PLACEMENT (IOC) RIGHT 4.61  00:29.6;  Surgeon: Birder Robson, MD;  Location: [facility];  Service: Ophthalmology;  Laterality: Right;  . CATARACT EXTRACTION W/PHACO Left 10/14/2019   Procedure: CATARACT EXTRACTION PHACO AND INTRAOCULAR LENS PLACEMENT (IOC) LEFT 6.76  00:37.1;  Surgeon: Birder Robson, MD;  Location: [facility];  Service: Ophthalmology;  Laterality: Left;  . CHOLECYSTECTOMY    . COLONOSCOPY WITH PROPOFOL N/A 10/22/2015   Procedure: COLONOSCOPY WITH PROPOFOL;  Surgeon: Manya Silvas, MD;  Location[facility] ENDOSCOPY;  Service: Endoscopy;  Laterality: N/A;  . COLONOSCOPY WITH PROPOFOL N/A 09/02/2019   Procedure: COLONOSCOPY WITH PROPOFOL;  Surgeon: Lin Landsman, MD;  Location: [facility] ENDOSCOPY;  Service: Gastroenterology;  Laterality: N/A;  . TONSILLECTOMY      Allergies: Allergies  Allergen Reactions  . Amlodipine Other (See Comments)    Per patient  .  Atenolol Other (See Comments)    fatigue  . Ciprofloxacin Other (See Comments)    Caused drug induced hepatitis / c-diff  . Ciprofloxacin Hcl Other (See Comments)    Hepatitis  . Lisinopril Other (See Comments)     Fatigue  . Moxifloxacin Other (See Comments)    Hepatitis  . Sertraline Other (See Comments)    Home Medications: (Not in a hospital admission)  Home medication reconciliation was completed with the patient.   Scheduled Inpatient Medications:   . atorvastatin  40 mg Oral Daily  . cholecalciferol  2,000 Units Oral Daily  . escitalopram  10 mg Oral Daily  . fluticasone  1 spray Each Nare Daily  . folic acid  948 mcg Oral Daily  . irbesartan  300 mg Oral Daily   And  . hydrochlorothiazide  12.5 mg Oral Daily  . metoprolol succinate  100 mg Oral Daily  . pantoprazole  40 mg Oral Daily    Continuous Inpatient Infusions:   . sodium chloride 100 mL/hr at 07/11/20 1319  . piperacillin-tazobactam (ZOSYN)  IV 3.375 g (07/11/20 1322)    PRN Inpatient Medications:  ondansetron **OR** ondansetron (ZOFRAN) IV, traZODone  Family History: family history includes Breast cancer (age of onset: 78) in her maternal grandmother.   GI Family History: Negative  Social History:   reports that she quit smoking about 19 years ago. She has never used smokeless tobacco. She reports current alcohol use. She reports that she does not use drugs. The patient denies ETOH, tobacco, or drug use.    Review of Systems: Review of Systems - Negative except that in the HPI  Physical Examination: BP (!) 166/78   Pulse (!) 59   Temp 98.3 F (36.8 C) (Oral)   Resp 10   Ht 5' 5"  (1.651 m)   Wt 83.9 kg   SpO2 98%   BMI 30.79 kg/m  Physical Exam Constitutional:      General: She is not in acute distress.    Appearance: She is well-developed. She is ill-appearing. She is not diaphoretic.  HENT:     Head: Normocephalic and atraumatic.  Eyes:     Extraocular Movements: Extraocular movements intact.     Pupils: Pupils are equal, round, and reactive to light.  Cardiovascular:     Heart sounds: Normal heart sounds. No murmur heard. No gallop.   Pulmonary:     Effort: Pulmonary effort is normal.      Breath sounds: Normal breath sounds.  Abdominal:     General: Bowel sounds are increased. There is distension.     Palpations: Abdomen is soft. There is no shifting dullness or fluid wave.     Tenderness: There is abdominal tenderness in the right lower quadrant, periumbilical area and left lower quadrant. There is no guarding or rebound.     Hernia: No hernia is present.  Skin:    General: Skin is warm and dry.  Neurological:     General: No focal deficit present.     Mental Status: She is alert.     Data: Lab Results  Component Value Date   WBC 12.1 (H) 07/11/2020   HGB 13.7 07/11/2020   HCT 41.9 07/11/2020   MCV 88.2 07/11/2020   PLT 215 07/11/2020   Recent Labs  Lab 07/11/20 0738  HGB 13.7   Lab Results  Component Value Date   NA 136 07/11/2020   K 3.8 07/11/2020   CL 97 (L) 07/11/2020   CO2 29  07/11/2020   BUN 13 07/11/2020   CREATININE 0.81 07/11/2020   Lab Results  Component Value Date   ALT 16 07/11/2020   AST 19 07/11/2020   ALKPHOS 60 07/11/2020   BILITOT 0.6 07/11/2020   No results for input(s): APTT, INR, PTT in the last 168 hours. CBC Latest Ref Rng & Units 07/11/2020 05/02/2020 09/04/2019  WBC 4.0 - 10.5 K/uL 12.1(H) 8.6 11.7(H)  Hemoglobin 12.0 - 15.0 g/dL 13.7 13.6 11.6(L)  Hematocrit 36.0 - 46.0 % 41.9 39.8 34.4(L)  Platelets 150 - 400 K/uL 215 192 182    STUDIES: CT ABDOMEN PELVIS W CONTRAST  Result Date: 07/11/2020 CLINICAL DATA:  Abdominal pain and diarrhea EXAM: CT ABDOMEN AND PELVIS WITH CONTRAST TECHNIQUE: Multidetector CT imaging of the abdomen and pelvis was performed using the standard protocol following bolus administration of intravenous contrast. CONTRAST:  130m OMNIPAQUE IOHEXOL 300 MG/ML  SOLN COMPARISON:  12/23/2019 FINDINGS: Lower chest:  No contributory findings. Hepatobiliary: No focal liver abnormality.Cholecystectomy. No bile duct dilatation. Pancreas: Unremarkable. Spleen: Unremarkable. Adrenals/Urinary Tract: Negative  adrenals. No hydronephrosis or stone. Unremarkable bladder. Stomach/Bowel: Extensive distal colonic diverticulosis. Mild adjacent fat inflammation is seen at the level of the sigmoid colon but there also appears to be a segment of focal submucosal low-density thickening. Vascular/Lymphatic: No acute vascular abnormality. Atheromatous calcification. No mass or adenopathy. Reproductive:Hysterectomy. Long-standing bilateral ovarian cysts noted since a 2013 ultrasound report. Two cysts seen on the right, the larger measuring 3.2 cm, unchanged from prior. More posterior 13 mm right ovarian cyst is unchanged. Unchanged small left ovarian cyst. Other: No ascites or pneumoperitoneum. Musculoskeletal: No acute abnormalities. IMPRESSION: 1. Suspect early sigmoid diverticulitis. Sigmoid colon diverticulosis is extensive. 2. Otherwise stable from 2021 comparison, as above. Aortic Atherosclerosis (ICD10-I70.0). Electronically Signed   By: JMonte FantasiaM.D.   On: 07/11/2020 10:12   @IMAGES @  Assessment: 1. Bloody diarrhea - Highly suggestive of acute ischemic colitis. Inpatient Colonoscopy by gastroenterologist Dr.Vanga ruled out malignancy, IBD, polyps, other causes.I don't believe this is diverticulitis.  COVID-19 status: Tested negative  Recommendations: 1. IV resuscitation. Discontinue IV antibiotics. 2. I expect full, though possibly temporary, recovery. 3. Consider surgical consult inpatient or outpatient to consider elective outpatient colectomy, given recurrent symptoms dating back to 2021 and confirmed on previous colonoscopy. 4. I will order a soft diet. 5. Following along.  Thank you for the consult. Please call with questions or concerns.  TOlean Ree "KLanny HurstMD K[facility]enter[facility] [address] [place] [phone] 07/11/2020 2:20 PM

## 2020-07-11 NOTE — ED Provider Notes (Signed)
[facility] Emergency Department Provider Note    Event Date/Time   First MD Initiated Contact with Patient 07/11/20 [phone]     (approximate)  I have reviewed the triage vital signs and the nursing notes.   HISTORY  Chief Complaint Abdominal Pain and Diarrhea    HPI Kerry Jefferson is a 75 y.o. female below listed past medical history also with a history of recurrent diverticulitis as well as colitis and C. difficile infection presents to the ER for crampy generalized abdominal pain as well as multiple episodes of what were initially watery diarrhea now developing bloody diarrhea.  Not having measured fevers but states that she is having trouble staying off the commode having multiple episodes of diarrhea per hour.  Does feel dehydrated and weak.  She is on a blood thinners.    Past Medical History:  Diagnosis Date  . Arthritis    hands, feet  . Cancer ([place])    skin  . GERD (gastroesophageal reflux disease)   . Hepatitis    "8-10 yrs ago". "brought on by antibiotics"  . Hyperlipidemia   . Hypertension   . Tendinitis   . Vertigo    1x/month   Family History  Problem Relation Age of Onset  . Breast cancer Maternal Grandmother 80   Past Surgical History:  Procedure Laterality Date  . ABDOMINAL HYSTERECTOMY    . BREAST BIOPSY Left 2003   negative  . CATARACT EXTRACTION W/PHACO Right 09/23/2019   Procedure: CATARACT EXTRACTION PHACO AND INTRAOCULAR LENS PLACEMENT (IOC) RIGHT 4.61  00:29.6;  Surgeon: Birder Robson, MD;  Location: [facility];  Service: Ophthalmology;  Laterality: Right;  . CATARACT EXTRACTION W/PHACO Left 10/14/2019   Procedure: CATARACT EXTRACTION PHACO AND INTRAOCULAR LENS PLACEMENT (IOC) LEFT 6.76  00:37.1;  Surgeon: Birder Robson, MD;  Location: [facility];  Service: Ophthalmology;  Laterality: Left;  . CHOLECYSTECTOMY    . COLONOSCOPY WITH PROPOFOL N/A 10/22/2015   Procedure: COLONOSCOPY WITH PROPOFOL;   Surgeon: Manya Silvas, MD;  Location: [facility] ENDOSCOPY;  Service: Endoscopy;  Laterality: N/A;  . COLONOSCOPY WITH PROPOFOL N/A 09/02/2019   Procedure: COLONOSCOPY WITH PROPOFOL;  Surgeon: Lin Landsman, MD;  Location: [facility] ENDOSCOPY;  Service: Gastroenterology;  Laterality: N/A;  . TONSILLECTOMY     Patient Active Problem List   Diagnosis Date Noted  . AKI (acute kidney injury) ([place]) 09/01/2019  . Hypokalemia 09/01/2019  . Colitis 08/31/2019  . Heart palpitations 12/25/2017  . Allergic rhinitis 08/20/2015  . Colon polyp 08/20/2015  . Cyst of right ovary 08/20/2015  . Endometriosis 08/20/2015  . Acid reflux 08/20/2015  . History of colitis 08/20/2015  . H/O viral illness 08/20/2015  . Osteopenia 08/20/2015  . 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 HLD (hyperlipidemia) 07/21/2015  . APC (atrial premature contractions) 07/21/2014  . Essential (primary) hypertension 02/10/2014  . Facial numbness 12/30/2013  . Jerking 12/30/2013  . Arthritis, degenerative 11/17/2013  . Benign essential HTN 11/10/2013  . Cephalalgia 11/10/2013  . Pure hypercholesterolemia 11/10/2013  . Feeling bilious 07/26/2011      Prior to Admission medications   Medication Sig Start Date End Date Taking? Authorizing Provider  cholecalciferol (VITAMIN D) 25 MCG (1000 UNIT) tablet Take 2,000 Units by mouth daily.    [provider]  Coenzyme Q10 (COQ-10 PO) Take 1 tablet by mouth daily.    [provider]  desonide (DESOWEN) 0.05 % cream  08/25/19   [provider]  escitalopram (LEXAPRO) 10 MG  tablet Take 1 tablet by mouth daily. 08/12/18   [provider]  fluticasone (FLONASE) 50 MCG/ACT nasal spray USE TWO SPRAY(S) IN EACH NOSTRIL ONCE DAILY. 06/07/18   [provider]  folic acid (FOLVITE) 102 MCG tablet Take by mouth.    [provider]  ketoconazole (NIZORAL) 2 % cream  08/25/19   [provider]  olmesartan-hydrochlorothiazide  (BENICAR HCT) 40-12.5 MG tablet Take 1 tablet by mouth daily. 07/30/19   [provider]  omeprazole (PRILOSEC) 40 MG capsule Take 40 mg by mouth daily as needed.    [provider]  simvastatin (ZOCOR) 80 MG tablet Take 40 mg by mouth daily at 6 PM.  04/07/13   [provider]  TOPROL XL 100 MG 24 hr tablet Take 100 mg by mouth daily.  03/04/13   [provider]  traZODone (DESYREL) 50 MG tablet Take 50 mg by mouth at bedtime as needed.  04/25/18   [provider]    Allergies Atenolol, Ciprofloxacin, Ciprofloxacin hcl, Lisinopril, Moxifloxacin, and Sertraline    Social History Social History   Tobacco Use  . Smoking status: Former Smoker    Quit date: 2003    Years since quitting: 19.1  . Smokeless tobacco: Never Used  . Tobacco comment: quit 10 years ago  Vaping Use  . Vaping Use: Never used  Substance Use Topics  . Alcohol use: Yes    Comment: occ.  . Drug use: No    Review of Systems Patient denies headaches, rhinorrhea, blurry vision, numbness, shortness of breath, chest pain, edema, cough, abdominal pain, nausea, vomiting, diarrhea, dysuria, fevers, rashes or hallucinations unless otherwise stated above in HPI. ____________________________________________   PHYSICAL EXAM:  VITAL SIGNS: Vitals:   07/11/20 0729  BP: (!) 181/90  Pulse: (!) 57  Resp: 16  Temp: 98.3 F (36.8 C)  SpO2: 99%    Constitutional: Alert and oriented.  Eyes: Conjunctivae are normal.  Head: Atraumatic. Nose: No congestion/rhinnorhea. Mouth/Throat: Mucous membranes are moist.   Neck: No stridor. Painless ROM.  Cardiovascular: Normal rate, regular rhythm. Grossly normal heart sounds.  Good peripheral circulation. Respiratory: Normal respiratory effort.  No retractions. Lungs CTAB. Gastrointestinal: Soft with mild generalized ttp.  No distention. No abdominal bruits. No CVA tenderness. Genitourinary:  Musculoskeletal: No lower extremity  tenderness nor edema.  No joint effusions. Neurologic:  Normal speech and language. No gross focal neurologic deficits are appreciated. No facial droop Skin:  Skin is warm, dry and intact. No rash noted. Psychiatric: Mood and affect are normal. Speech and behavior are normal.  ____________________________________________   LABS (all labs ordered are listed, but only abnormal results are displayed)  Results for orders placed or performed during the hospital encounter of 07/11/20 (from the past 24 hour(s))  Lipase, blood     Status: Abnormal   Collection Time: 07/11/20  7:38 AM  Result Value Ref Range   Lipase 65 (H) 11 - 51 U/L  Comprehensive metabolic panel     Status: Abnormal   Collection Time: 07/11/20  7:38 AM  Result Value Ref Range   Sodium 136 135 - 145 mmol/L   Potassium 3.8 3.5 - 5.1 mmol/L   Chloride 97 (L) 98 - 111 mmol/L   CO2 29 22 - 32 mmol/L   Glucose, Bld 116 (H) 70 - 99 mg/dL   BUN 13 8 - 23 mg/dL   Creatinine, Ser 0.81 0.44 - 1.00 mg/dL   Calcium 9.2 8.9 - 10.3 mg/dL   Total  Protein 7.0 6.5 - 8.1 g/dL   Albumin 4.2 3.5 - 5.0 g/dL   AST 19 15 - 41 U/L   ALT 16 0 - 44 U/L   Alkaline Phosphatase 60 38 - 126 U/L   Total Bilirubin 0.6 0.3 - 1.2 mg/dL   GFR, Estimated >60 >60 mL/min   Anion gap 10 5 - 15  CBC     Status: Abnormal   Collection Time: 07/11/20  7:38 AM  Result Value Ref Range   WBC 12.1 (H) 4.0 - 10.5 K/uL   RBC 4.75 3.87 - 5.11 MIL/uL   Hemoglobin 13.7 12.0 - 15.0 g/dL   HCT 41.9 36.0 - 46.0 %   MCV 88.2 80.0 - 100.0 fL   MCH 28.8 26.0 - 34.0 pg   MCHC 32.7 30.0 - 36.0 g/dL   RDW 14.0 11.5 - 15.5 %   Platelets 215 150 - 400 K/uL   nRBC 0.0 0.0 - 0.2 %   ____________________________________________  ____________________________________________  RADIOLOGY  I personally reviewed all radiographic images ordered to evaluate for the above acute complaints and reviewed radiology reports and findings.  These findings were personally discussed  with the patient.  Please see medical record for radiology report.  ____________________________________________   PROCEDURES  Procedure(s) performed:  Procedures    Critical Care performed: no ____________________________________________   INITIAL IMPRESSION / ASSESSMENT AND PLAN / ED COURSE  Pertinent labs & imaging results that were available during my care of the patient were reviewed by me and considered in my medical decision making (see chart for details).   DDX: Dehydration, diverticulitis, colitis, C. difficile, mass, electrolyte abnormality, anemia  KELCEY KORUS is a 75 y.o. who presents to the ED with presentation as described above.  Patient nontoxic-appearing but having what sounds like fairly severe diarrheal episodes now developing bloody diarrhea.  CT imaging ordered for the but differential does show evidence of probable early sigmoid diverticulitis without abscess or perforation.  Is not febrile have ordered IV fluids and IV antibiotics.  She has had additional episodes of bloody diarrhea here given her age with extensive diverticular disease with bloody diarrhea I think that observation is indicated for serial hemoglobins and additional IV hydration.  Will discuss with hospitalist.     The patient was evaluated in Emergency Department today for the symptoms described in the history of present illness. He/she was evaluated in the context of the global COVID-19 pandemic, which necessitated consideration that the patient might be at risk for infection with the SARS-CoV-2 virus that causes COVID-19. Institutional protocols and algorithms that pertain to the evaluation of patients at risk for COVID-19 are in a state of rapid change based on information released by regulatory bodies including the CDC and federal and state organizations. These policies and algorithms were followed during the patient's care in the ED.  As part of my medical decision making, I reviewed the  following data within the [facility] notes reviewed and incorporated, Labs reviewed, notes from prior ED visits and [place] Controlled Substance Database   ____________________________________________   FINAL CLINICAL IMPRESSION(S) / ED DIAGNOSES  Final diagnoses:  Bloody diarrhea  Diverticulitis      NEW MEDICATIONS STARTED DURING THIS VISIT:  New Prescriptions   No medications on file     Note:  This document was prepared using Dragon voice recognition software and may include unintentional dictation errors.    Merlyn Lot, MD 07/11/20 1047

## 2020-07-11 NOTE — H&P (Addendum)
History and Physical    Kerry Jefferson QMG:[id number] DOB: April 01, 1946 DOA: 07/11/2020  PCP: Idelle Crouch, MD   Patient coming from: Home  I have personally briefly reviewed patient's old medical records in [place]  Chief Complaint: Abdominal pain/bloody diarrhea  HPI: Kerry Jefferson is a 75 y.o. female with medical history significant for diverticular disease, hypertension, GERD and dyslipidemia who presents to the ER via private vehicle for evaluation of abdominal pain mostly suprapubic and left lower quadrant.  She rates her pain a 6 x 10 in intensity at its worst and it is nonradiating.  Patient states that she woke up early on the morning of admission with the pain.  She has had pain like this in the past similar to her prior episodes of diverticulitis.  Patient had some strawberries the day before her admission.  She denies having known aggravating or relieving factors.  Abdominal pain was initially associated with diarrhea but now she has multiple episodes of rectal bleeding.  She also has nausea but denies having any vomiting She denies feeling dizzy or lightheaded, she denies having any falls, no palpitations, no chest pain, no shortness of breath, no lower extremity swelling, no anorexia, no constipation, no urinary frequency, no nocturia, no dysuria, no fever, no chills, no headache, no blurred vision, no weakness or focal deficits. Labs show sodium 136, potassium 3.8, chloride 97, bicarb 29, glucose 116, BUN 13, creatinine 0.81, calcium 9.2, alkaline phosphatase 60, albumin 4.2, lipase 65, AST 19, ALT 16, total protein 7.0, white count 12.1, hemoglobin 13.7, hematocrit 41.9, MCV 88.2, RDW 14.0, platelet count 215 Her SARS coronavirus 2 point-of-care test is pending Urinalysis is sterile CT scan of abdomen and pelvis shows suspect early sigmoid diverticulitis. Sigmoid colon diverticulosis is extensive. Otherwise stable from 2021 comparison, as above.    ED Course:  Patient is a 75 year old Caucasian female who presents to the ER for evaluation of abdominal pain mostly suprapubic and involving the left lower quadrant associated with rectal bleeding.  She has a known history of diverticular disease and has had flareups in the past similar to this episode. She admits to eating some strawberries 1 day prior to presentation.  She will be admitted to the hospital for further evaluation.   Review of Systems: As per HPI otherwise all other systems reviewed and negative.    Past Medical History:  Diagnosis Date  . Arthritis    hands, feet  . Cancer ([place])    skin  . GERD (gastroesophageal reflux disease)   . Hepatitis    "8-10 yrs ago". "brought on by antibiotics"  . Hyperlipidemia   . Hypertension   . Tendinitis   . Vertigo    1x/month    Past Surgical History:  Procedure Laterality Date  . ABDOMINAL HYSTERECTOMY    . BREAST BIOPSY Left 2003   negative  . CATARACT EXTRACTION W/PHACO Right 09/23/2019   Procedure: CATARACT EXTRACTION PHACO AND INTRAOCULAR LENS PLACEMENT (IOC) RIGHT 4.61  00:29.6;  Surgeon: Birder Robson, MD;  Location: [place];  Service: Ophthalmology;  Laterality: Right;  . CATARACT EXTRACTION W/PHACO Left 10/14/2019   Procedure: CATARACT EXTRACTION PHACO AND INTRAOCULAR LENS PLACEMENT (IOC) LEFT 6.76  00:37.1;  Surgeon: Birder Robson, MD;  Location: [place];  Service: Ophthalmology;  Laterality: Left;  . CHOLECYSTECTOMY    . COLONOSCOPY WITH PROPOFOL N/A 10/22/2015   Procedure: COLONOSCOPY WITH PROPOFOL;  Surgeon: Manya Silvas, MD;  Location: [facility] ENDOSCOPY;  Service: Endoscopy;  Laterality: N/A;  . COLONOSCOPY WITH PROPOFOL N/A 09/02/2019   Procedure: COLONOSCOPY WITH PROPOFOL;  Surgeon: Lin Landsman, MD;  Location[facility] ENDOSCOPY;  Service: Gastroenterology;  Laterality: N/A;  . TONSILLECTOMY       reports that she quit smoking about 19 years ago. She has never used smokeless tobacco. She  reports current alcohol use. She reports that she does not use drugs.  Allergies  Allergen Reactions  . Amlodipine Other (See Comments)    Per patient  . Atenolol Other (See Comments)    fatigue  . Ciprofloxacin Other (See Comments)    Caused drug induced hepatitis / c-diff  . Ciprofloxacin Hcl Other (See Comments)    Hepatitis  . Lisinopril Other (See Comments)    Fatigue  . Moxifloxacin Other (See Comments)    Hepatitis  . Sertraline Other (See Comments)    Family History  Problem Relation Age of Onset  . Breast cancer Maternal Grandmother 80      Prior to Admission medications   Medication Sig Start Date End Date Taking? Authorizing Provider  Calcium Carbonate-Vitamin D 600-200 MG-UNIT TABS Take 1 tablet by mouth daily. 08/05/12  Yes [provider]  cholecalciferol (VITAMIN D) 25 MCG (1000 UNIT) tablet Take 2,000 Units by mouth daily.   Yes [provider]  cloNIDine (CATAPRES) 0.1 MG tablet Take 0.1 mg by mouth 2 (two) times daily as needed. 04/23/20  Yes [provider]  Coenzyme Q10 (COQ-10 PO) Take 1 tablet by mouth daily.   Yes [provider]  desonide (DESOWEN) 0.05 % cream  08/25/19  Yes [provider]  folic acid (FOLVITE) 170 MCG tablet Take by mouth.   Yes [provider]  olmesartan-hydrochlorothiazide (BENICAR HCT) 40-12.5 MG tablet Take 1 tablet by mouth daily. 07/30/19  Yes [provider]  rosuvastatin (CRESTOR) 5 MG tablet Take 5 mg by mouth daily. 05/25/20  Yes [provider]  TOPROL XL 100 MG 24 hr tablet Take 100 mg by mouth daily.  03/04/13  Yes [provider]  traZODone (DESYREL) 50 MG tablet Take 50 mg by mouth at bedtime as needed.  04/25/18  Yes [provider]  fluticasone (FLONASE) 50 MCG/ACT nasal spray USE TWO SPRAY(S) IN EACH NOSTRIL ONCE DAILY. 06/07/18   [provider]  ketoconazole (NIZORAL) 2 % cream  08/25/19   [provider]  omeprazole  (PRILOSEC) 40 MG capsule Take 40 mg by mouth daily as needed.    [provider]    Physical Exam: Vitals:   07/11/20 1057 07/11/20 1100 07/11/20 1130 07/11/20 1200  BP: (!) 183/80 (!) 178/81 (!) 171/89 (!) 182/89  Pulse: 62 61 69 73  Resp: 17 11 14 19   Temp:      TempSrc:      SpO2: 100% 98% 100% 100%  Weight:      Height:         Vitals:   07/11/20 1057 07/11/20 1100 07/11/20 1130 07/11/20 1200  BP: (!) 183/80 (!) 178/81 (!) 171/89 (!) 182/89  Pulse: 62 61 69 73  Resp: 17 11 14 19   Temp:      TempSrc:      SpO2: 100% 98% 100% 100%  Weight:      Height:          Constitutional: Alert and oriented x 3 . Not in any apparent distress.  Appears comfortable HEENT:      Head: Normocephalic and atraumatic.  Eyes: PERLA, EOMI, Conjunctivae are normal. Sclera is non-icteric.       Mouth/Throat: Mucous membranes are moist.       Neck: Supple with no signs of meningismus. Cardiovascular: Regular rate and rhythm. No murmurs, gallops, or rubs. 2+ symmetrical distal pulses are present . No JVD. No LE edema Respiratory: Respiratory effort normal .Lungs sounds clear bilaterally. No wheezes, crackles, or rhonchi.  Gastrointestinal: Soft, suprapubic and left lower quadrant tenderness, and non distended with positive bowel sounds.  Genitourinary: No CVA tenderness. Musculoskeletal: Nontender with normal range of motion in all extremities. No cyanosis, or erythema of extremities. Neurologic:  Face is symmetric. Moving all extremities. No gross focal neurologic deficits  Skin: Skin is warm, dry.  No rash or ulcers Psychiatric: Mood and affect are normal   Labs on Admission: I have personally reviewed following labs and imaging studies  CBC: Recent Labs  Lab 07/11/20 0738  WBC 12.1*  HGB 13.7  HCT 41.9  MCV 88.2  PLT 224   Basic Metabolic Panel: Recent Labs  Lab 07/11/20 0738  NA 136  K 3.8  CL 97*  CO2 29  GLUCOSE 116*  BUN 13  CREATININE 0.81   CALCIUM 9.2   GFR: Estimated Creatinine Clearance: 63.2 mL/min (by C-G formula based on SCr of 0.81 mg/dL). Liver Function Tests: Recent Labs  Lab 07/11/20 0738  AST 19  ALT 16  ALKPHOS 60  BILITOT 0.6  PROT 7.0  ALBUMIN 4.2   Recent Labs  Lab 07/11/20 0738  LIPASE 65*   No results for input(s): AMMONIA in the last 168 hours. Coagulation Profile: No results for input(s): INR, PROTIME in the last 168 hours. Cardiac Enzymes: No results for input(s): CKTOTAL, CKMB, CKMBINDEX, TROPONINI in the last 168 hours. BNP (last 3 results) No results for input(s): PROBNP in the last 8760 hours. HbA1C: No results for input(s): HGBA1C in the last 72 hours. CBG: No results for input(s): GLUCAP in the last 168 hours. Lipid Profile: No results for input(s): CHOL, HDL, LDLCALC, TRIG, CHOLHDL, LDLDIRECT in the last 72 hours. Thyroid Function Tests: No results for input(s): TSH, T4TOTAL, FREET4, T3FREE, THYROIDAB in the last 72 hours. Anemia Panel: No results for input(s): VITAMINB12, FOLATE, FERRITIN, TIBC, IRON, RETICCTPCT in the last 72 hours. Urine analysis:    Component Value Date/Time   COLORURINE YELLOW (A) 07/11/2020 1100   APPEARANCEUR CLEAR (A) 07/11/2020 1100   LABSPEC 1.021 07/11/2020 1100   PHURINE 6.0 07/11/2020 1100   GLUCOSEU NEGATIVE 07/11/2020 1100   HGBUR NEGATIVE 07/11/2020 1100   BILIRUBINUR NEGATIVE 07/11/2020 1100   KETONESUR NEGATIVE 07/11/2020 1100   PROTEINUR NEGATIVE 07/11/2020 1100   NITRITE NEGATIVE 07/11/2020 1100   LEUKOCYTESUR TRACE (A) 07/11/2020 1100    Radiological Exams on Admission: CT ABDOMEN PELVIS W CONTRAST  Result Date: 07/11/2020 CLINICAL DATA:  Abdominal pain and diarrhea EXAM: CT ABDOMEN AND PELVIS WITH CONTRAST TECHNIQUE: Multidetector CT imaging of the abdomen and pelvis was performed using the standard protocol following bolus administration of intravenous contrast. CONTRAST:  121m OMNIPAQUE IOHEXOL 300 MG/ML  SOLN COMPARISON:   12/23/2019 FINDINGS: Lower chest:  No contributory findings. Hepatobiliary: No focal liver abnormality.Cholecystectomy. No bile duct dilatation. Pancreas: Unremarkable. Spleen: Unremarkable. Adrenals/Urinary Tract: Negative adrenals. No hydronephrosis or stone. Unremarkable bladder. Stomach/Bowel: Extensive distal colonic diverticulosis. Mild adjacent fat inflammation is seen at the level of the sigmoid colon but there also appears to be a segment of focal submucosal low-density thickening. Vascular/Lymphatic: No acute vascular abnormality. Atheromatous calcification. No  mass or adenopathy. Reproductive:Hysterectomy. Long-standing bilateral ovarian cysts noted since a 2013 ultrasound report. Two cysts seen on the right, the larger measuring 3.2 cm, unchanged from prior. More posterior 13 mm right ovarian cyst is unchanged. Unchanged small left ovarian cyst. Other: No ascites or pneumoperitoneum. Musculoskeletal: No acute abnormalities. IMPRESSION: 1. Suspect early sigmoid diverticulitis. Sigmoid colon diverticulosis is extensive. 2. Otherwise stable from 2021 comparison, as above. Aortic Atherosclerosis (ICD10-I70.0). Electronically Signed   By: Monte Fantasia M.D.   On: 07/11/2020 10:12     Assessment/Plan Principal Problem:   Acute diverticulitis Active Problems:   Benign essential HTN   HLD (hyperlipidemia)   Acute diverticulitis Patient with known diverticular disease who presents with pain in the suprapubic and left lower quadrant associated with rectal bleeding We will place patient on Zosyn adjusted to renal function Monitor serial H&H and transfuse as needed Pain control Consult gastroenterology    Hypertension Continue Toprol, HCTZ and Avapro   Depression Continue Lexapro   Dyslipidemia Continue statins    DVT prophylaxis: SCD Code Status: full code Family Communication: Greater than 50% of time was spent discussing patient's condition and plan of care with her at the  bedside.  All questions and concerns have been addressed.  She verbalizes understanding and agrees with the plan.  CODE STATUS was discussed and she is a full code. Disposition Plan: Back to previous home environment Consults called: Gastroenterology Status: Observation    Kayshawn Ozburn MD [facility]     07/11/2020, 12:22 PM

## 2020-07-11 NOTE — Consult Note (Addendum)
Pharmacy Antibiotic Note  Kerry Jefferson is a 75 y.o. female admitted on 07/11/2020 with possible diverticulitis and colitis.  Pharmacy has been consulted for Zosyn dosing.  Plan: Zosyn 3.375g IV q8h (4 hour infusion).  Height: 5' 5"  (165.1 cm) Weight: 83.9 kg (185 lb) IBW/kg (Calculated) : 57  Temp (24hrs), Avg:98.3 F (36.8 C), Min:98.3 F (36.8 C), Max:98.3 F (36.8 C)  Recent Labs  Lab 07/11/20 0738  WBC 12.1*  CREATININE 0.81    Estimated Creatinine Clearance: 63.2 mL/min (by C-G formula based on SCr of 0.81 mg/dL).    Allergies  Allergen Reactions  . Amlodipine Other (See Comments)    Per patient  . Atenolol Other (See Comments)    fatigue  . Ciprofloxacin Other (See Comments)    Caused drug induced hepatitis / c-diff  . Ciprofloxacin Hcl Other (See Comments)    Hepatitis  . Lisinopril Other (See Comments)    Fatigue  . Moxifloxacin Other (See Comments)    Hepatitis  . Sertraline Other (See Comments)    Antimicrobials this admission: CTX/Flagyl 2/20 x1 dose Zosyn 2/20 >>     Thank you for allowing pharmacy to be a part of this patient's care.  Lance Coon A Tobin Witucki 07/11/2020 12:22 PM

## 2020-07-11 NOTE — ED Notes (Signed)
Pt went to restroom mid-triage. Pt back from bathroom now stating she saw blood in her stool. Will inform triage nurse.

## 2020-07-11 NOTE — ED Triage Notes (Signed)
Patient c/o abdominal pain and diarrhea that started around 0330 today. HX diverticulitis and colitis. Reports eating strawberries yesterday and thinks it caused symptoms.

## 2020-07-11 NOTE — ED Notes (Signed)
Pt came to ED for bloody stools and abdominal pain that started this morning. Pt has dx of diverticulitis.

## 2020-07-12 DIAGNOSIS — R1084 Generalized abdominal pain: Secondary | ICD-10-CM | POA: Diagnosis not present

## 2020-07-12 DIAGNOSIS — R197 Diarrhea, unspecified: Secondary | ICD-10-CM | POA: Diagnosis not present

## 2020-07-12 DIAGNOSIS — K921 Melena: Secondary | ICD-10-CM

## 2020-07-12 DIAGNOSIS — K5792 Diverticulitis of intestine, part unspecified, without perforation or abscess without bleeding: Secondary | ICD-10-CM | POA: Diagnosis not present

## 2020-07-12 LAB — COMPREHENSIVE METABOLIC PANEL
ALT: 12 U/L (ref 0–44)
AST: 15 U/L (ref 15–41)
Albumin: 3.4 g/dL — ABNORMAL LOW (ref 3.5–5.0)
Alkaline Phosphatase: 43 U/L (ref 38–126)
Anion gap: 6 (ref 5–15)
BUN: 10 mg/dL (ref 8–23)
CO2: 26 mmol/L (ref 22–32)
Calcium: 8.1 mg/dL — ABNORMAL LOW (ref 8.9–10.3)
Chloride: 102 mmol/L (ref 98–111)
Creatinine, Ser: 0.78 mg/dL (ref 0.44–1.00)
GFR, Estimated: >60 mL/min (ref >60)
Glucose, Bld: 90 mg/dL (ref 70–99)
Potassium: 3.4 mmol/L — ABNORMAL LOW (ref 3.5–5.1)
Sodium: 134 mmol/L — ABNORMAL LOW (ref 135–145)
Total Bilirubin: 0.8 mg/dL (ref 0.3–1.2)
Total Protein: 5.7 g/dL — ABNORMAL LOW (ref 6.5–8.1)

## 2020-07-12 LAB — HEMOGLOBIN AND HEMATOCRIT, BLOOD
HCT: 34.4 % — ABNORMAL LOW (ref 36.0–46.0)
Hemoglobin: 11.3 g/dL — ABNORMAL LOW (ref 12.0–15.0)

## 2020-07-12 MED ORDER — ACETAMINOPHEN 325 MG PO TABS: 650.0000 mg | ORAL_TABLET | Freq: Four times a day (QID) | ORAL | Status: DC | PRN

## 2020-07-12 MED ORDER — DM-GUAIFENESIN ER 30-600 MG PO TB12: 1.0000 | ORAL_TABLET | Freq: Two times a day (BID) | ORAL | Status: DC | PRN

## 2020-07-12 MED ORDER — POTASSIUM CHLORIDE CRYS ER 20 MEQ PO TBCR
40.0000 meq | EXTENDED_RELEASE_TABLET | Freq: Once | ORAL | Status: AC
  Administered 2020-07-12: 40 meq via ORAL
  Filled 2020-07-12: qty 2

## 2020-07-12 MED ORDER — ALUM & MAG HYDROXIDE-SIMETH 200-200-20 MG/5ML PO SUSP: 30.0000 mL | ORAL | Status: DC | PRN

## 2020-07-12 MED ORDER — HYDROCORTISONE (PERIANAL) 2.5 % EX CREA
1.0000 "application " | TOPICAL_CREAM | Freq: Four times a day (QID) | CUTANEOUS | Status: DC | PRN
  Filled 2020-07-12: qty 28.35

## 2020-07-12 MED ORDER — SENNOSIDES-DOCUSATE SODIUM 8.6-50 MG PO TABS: 1.0000 | ORAL_TABLET | Freq: Every evening | ORAL | Status: DC | PRN

## 2020-07-12 MED ORDER — SODIUM CHLORIDE 0.9 % IV SOLN: INTRAVENOUS | Status: DC

## 2020-07-12 MED ORDER — PHENOL 1.4 % MT LIQD
1.0000 | OROMUCOSAL | Status: DC | PRN
  Filled 2020-07-12: qty 177

## 2020-07-12 MED ORDER — MUSCLE RUB 10-15 % EX CREA
1.0000 "application " | TOPICAL_CREAM | CUTANEOUS | Status: DC | PRN
  Filled 2020-07-12: qty 85

## 2020-07-12 MED ORDER — HYDROCORTISONE 1 % EX CREA
1.0000 "application " | TOPICAL_CREAM | Freq: Three times a day (TID) | CUTANEOUS | Status: DC | PRN
  Filled 2020-07-12: qty 28

## 2020-07-12 MED ORDER — LORATADINE 10 MG PO TABS: 10.0000 mg | ORAL_TABLET | Freq: Every day | ORAL | Status: DC | PRN

## 2020-07-12 MED ORDER — METHYLPREDNISOLONE SODIUM SUCC 40 MG IJ SOLR
40.0000 mg | Freq: Three times a day (TID) | INTRAMUSCULAR | Status: DC
  Administered 2020-07-12: 09:00:00 40 mg via INTRAVENOUS
  Filled 2020-07-12: qty 1

## 2020-07-12 MED ORDER — HYDRALAZINE HCL 20 MG/ML IJ SOLN: 10.0000 mg | INTRAMUSCULAR | Status: DC | PRN

## 2020-07-12 MED ORDER — IPRATROPIUM-ALBUTEROL 0.5-2.5 (3) MG/3ML IN SOLN: 3.0000 mL | RESPIRATORY_TRACT | Status: DC | PRN

## 2020-07-12 MED ORDER — SALINE SPRAY 0.65 % NA SOLN
1.0000 | NASAL | Status: DC | PRN
  Filled 2020-07-12: qty 44

## 2020-07-12 MED ORDER — POLYVINYL ALCOHOL 1.4 % OP SOLN
1.0000 [drp] | OPHTHALMIC | Status: DC | PRN
  Filled 2020-07-12: qty 15

## 2020-07-12 MED ORDER — BLISTEX MEDICATED EX OINT
1.0000 "application " | TOPICAL_OINTMENT | CUTANEOUS | Status: DC | PRN
  Filled 2020-07-12: qty 6.3

## 2020-07-12 NOTE — Consult Note (Addendum)
[facility] SURGICAL CONSULTATION NOTE (initial) - cpt: 99255   HISTORY OF PRESENT ILLNESS (HPI):  75 y.o. female presented to [facility] ED yesterday for evaluation of abdominal pain and diarrhea. Patient reports the acute onset of lower abdominal discomfort and crampy pain Sunday morning around 3 AM. This was followed by multiple episodes of loose stool which ultimately became bloody around 9 AM that morning. This prompted her presentation to the ED. She reports that her abdominal pain was not severe in nature. No associated fever, chills, nausea, emesis. She has a history of similar in the past which I have outlined below. Only previous abdominal surgeries is laparoscopic cholecystectomy and abdominal hysterectomy. Laboratory work up in the ED revealed leukocytosis to 12.1K, Hgb 13.7 (most recent 11.3), mild lipase elevation to 63, preserved renal function with sCr - 0.81, no significant electrolyte derangements, and  Diff and GI Panel were negative. She again underwent CT Abdomen/Pelvis which was concerning for possible early sigmoid diverticulitis. Patient was admitted to medicine service with GI consult. Patient was seen by Dr Alice Reichert yesterday and thought her symptoms may be more likely attributable to ischemic colitis rather than diverticulitis.   She has a history of similar in the past. She reports multiple episodes, typically once a year, for "a while now." She has been following with Dr. Alice Reichert and Octavia Bruckner PA-C as an outpatient for this. She was admitted in April of 2021 for the same, and at that time was seen by Dr Marius Ditch. She underwent colonoscopy on 04/13 which showed pan diverticulosis, worse on the left as well as diffuse severe inflammation, friability, and loss of vasculature in the left colon in sigmoid to splenic flexure. Biopsies from this were most consistent with ischemic changes without evidence of colitis, dysplasia, nor malignancy. 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 It seems that she had done reasonably following that admission and last followed up with GI on 10/2019.   Surgery is consulted by hospitalist physician Dr. Reesa Chew, MD in this context for evaluation and management of possible ischemic colitis vs diverticulitis and role for potential colectomy.   PAST MEDICAL HISTORY (PMH):  Past Medical History:  Diagnosis Date   Arthritis    hands, feet   Cancer ([place])    skin   GERD (gastroesophageal reflux disease)    Hepatitis    "8-10 yrs ago". "brought on by antibiotics"   Hyperlipidemia    Hypertension    Tendinitis    Vertigo    1x/month     PAST SURGICAL HISTORY ([place]):  Past Surgical History:  Procedure Laterality Date   ABDOMINAL HYSTERECTOMY     BREAST BIOPSY Left 2003   negative   CATARACT EXTRACTION W/PHACO Right 09/23/2019   Procedure: CATARACT EXTRACTION PHACO AND INTRAOCULAR LENS PLACEMENT (IOC) RIGHT 4.61  00:29.6;  Surgeon: Birder Robson, MD;  Location: [place];  Service: Ophthalmology;  Laterality: Right;   CATARACT EXTRACTION W/PHACO Left 10/14/2019   Procedure: CATARACT EXTRACTION PHACO AND INTRAOCULAR LENS PLACEMENT (IOC) LEFT 6.76  00:37.1;  Surgeon: Birder Robson, MD;  Location: [place];  Service: Ophthalmology;  Laterality: Left;   CHOLECYSTECTOMY     COLONOSCOPY WITH PROPOFOL N/A 10/22/2015   Procedure: COLONOSCOPY WITH PROPOFOL;  Surgeon: Manya Silvas, MD;  Location: [facility] ENDOSCOPY;  Service: Endoscopy;  Laterality: N/A;   COLONOSCOPY WITH PROPOFOL N/A 09/02/2019   Procedure: COLONOSCOPY WITH PROPOFOL;  Surgeon: Sherri Sear  Reece Levy, MD;  Location: [place];  Service: Gastroenterology;  Laterality: N/A;   TONSILLECTOMY       MEDICATIONS:  Prior to Admission medications   Medication Sig Start Date End Date Taking? Authorizing Provider  Calcium Carbonate-Vitamin D 600-200 MG-UNIT TABS Take 1  tablet by mouth daily. 08/05/12  Yes [provider]  cholecalciferol (VITAMIN D) 25 MCG (1000 UNIT) tablet Take 2,000 Units by mouth daily.   Yes [provider]  cloNIDine (CATAPRES) 0.1 MG tablet Take 0.1 mg by mouth 2 (two) times daily as needed. 04/23/20  Yes [provider]  Coenzyme Q10 (COQ-10 PO) Take 1 tablet by mouth daily.   Yes [provider]  desonide (DESOWEN) 0.05 % cream  08/25/19  Yes [provider]  folic acid (FOLVITE) 536 MCG tablet Take by mouth.   Yes [provider]  olmesartan-hydrochlorothiazide (BENICAR HCT) 40-12.5 MG tablet Take 1 tablet by mouth daily. 07/30/19  Yes [provider]  rosuvastatin (CRESTOR) 5 MG tablet Take 5 mg by mouth daily. 05/25/20  Yes [provider]  TOPROL XL 100 MG 24 hr tablet Take 100 mg by mouth daily.  03/04/13  Yes [provider]  traZODone (DESYREL) 50 MG tablet Take 150 mg by mouth at bedtime as needed. 04/25/18  Yes [provider]  fluticasone (FLONASE) 50 MCG/ACT nasal spray USE TWO SPRAY(S) IN EACH NOSTRIL ONCE DAILY. 06/07/18   [provider]  ketoconazole (NIZORAL) 2 % cream  08/25/19   [provider]  omeprazole (PRILOSEC) 40 MG capsule Take 40 mg by mouth daily as needed.    [provider]     ALLERGIES:  Allergies  Allergen Reactions   Amlodipine Other (See Comments)    Per patient   Atenolol Other (See Comments)    fatigue   Ciprofloxacin Other (See Comments)    Caused drug induced hepatitis / c-diff   Ciprofloxacin Hcl Other (See Comments)    Hepatitis   Lisinopril Other (See Comments)    Fatigue   Moxifloxacin Other (See Comments)    Hepatitis   Sertraline Other (See Comments)     SOCIAL HISTORY:  Social History   Socioeconomic History   Marital status: Married    Spouse name: Not on file   Number of children: Not on file   Years of education: Not on file   Highest education level: Not on file   Occupational History   Not on file  Tobacco Use   Smoking status: Former Smoker    Quit date: 2003    Years since quitting: 19.1   Smokeless tobacco: Never Used   Tobacco comment: quit 10 years ago  Vaping Use   Vaping Use: Never used  Substance and Sexual Activity   Alcohol use: Yes    Comment: occ.   Drug use: No   Sexual activity: Not on file  Other Topics Concern   Not on file  Social History Narrative   Not on file   Social Determinants of Health   Financial Resource Strain: Not on file  Food Insecurity: Not on file  Transportation Needs: Not on file  Physical Activity: Not on file  Stress: Not on file  Social Connections: Not on file  Intimate Partner Violence: Not on file     FAMILY HISTORY:  Family History  Problem Relation Age of Onset   Breast cancer Maternal Grandmother 72      REVIEW OF SYSTEMS:  Review of Systems  Constitutional: Negative  for chills and fever.  HENT: Negative for congestion and sore throat.   Respiratory: Negative for cough and shortness of breath.   Cardiovascular: Negative for chest pain and palpitations.  Gastrointestinal: Positive for abdominal pain, blood in stool and diarrhea. Negative for nausea and vomiting.  Genitourinary: Negative for dysuria and urgency.  All other systems reviewed and are negative.   VITAL SIGNS:  Temp:  [98.2 F (36.8 C)-98.4 F (36.9 C)] 98.4 F (36.9 C) (02/21 0718) Pulse Rate:  [52-102] 58 (02/21 0718) Resp:  [10-19] 15 (02/21 0718) BP: (125-183)/(63-101) 146/63 (02/21 0718) SpO2:  [94 %-100 %] 94 % (02/21 0718)     Height: 5' 5"  (165.1 cm) Weight: 83.9 kg BMI (Calculated): 30.79   INTAKE/OUTPUT:  02/20 0701 - 02/21 0700 In: 1661.7 [I.V.:1661.7] Out: -   PHYSICAL EXAM:  Physical Exam Vitals and nursing note reviewed. Exam conducted with a chaperone present.  Constitutional:      General: She is not in acute distress.    Appearance: She is well-developed. She is obese. She is not  ill-appearing.  HENT:     Head: Normocephalic and atraumatic.  Eyes:     General: No scleral icterus.    Extraocular Movements: Extraocular movements intact.  Cardiovascular:     Rate and Rhythm: Normal rate and regular rhythm.  Pulmonary:     Effort: Pulmonary effort is normal. No respiratory distress.  Abdominal:     General: Abdomen is flat. A surgical scar is present. There is no distension.     Palpations: Abdomen is soft.     Tenderness: There is no abdominal tenderness. There is no guarding or rebound.     Comments: Soft, non-tender, non-distended, no rebound/guarding. Previous surgical scars noted.   Genitourinary:    Comments: Deferred Skin:    General: Skin is warm and dry.     Coloration: Skin is not jaundiced or pale.  Neurological:     General: No focal deficit present.     Mental Status: She is alert and oriented to person, place, and time.  Psychiatric:        Mood and Affect: Mood normal.        Behavior: Behavior normal.      Labs:  CBC Latest Ref Rng & Units 07/12/2020 07/11/2020 07/11/2020  WBC 4.0 - 10.5 K/uL - - 12.1(H)  Hemoglobin 12.0 - 15.0 g/dL 11.3(L) 12.0 13.7  Hematocrit 36.0 - 46.0 % 34.4(L) 36.2 41.9  Platelets 150 - 400 K/uL - - 215   CMP Latest Ref Rng & Units 07/12/2020 07/11/2020 05/02/2020  Glucose 70 - 99 mg/dL 90 116(H) 98  BUN 8 - 23 mg/dL 10 13 13   Creatinine 0.44 - 1.00 mg/dL 0.78 0.81 0.84  Sodium 135 - 145 mmol/L 134(L) 136 130(L)  Potassium 3.5 - 5.1 mmol/L 3.4(L) 3.8 3.4(L)  Chloride 98 - 111 mmol/L 102 97(L) 92(L)  CO2 22 - 32 mmol/L 26 29 29   Calcium 8.9 - 10.3 mg/dL 8.1(L) 9.2 9.1  Total Protein 6.5 - 8.1 g/dL 5.7(L) 7.0 6.9  Total Bilirubin 0.3 - 1.2 mg/dL 0.8 0.6 0.9  Alkaline Phos 38 - 126 U/L 43 60 55  AST 15 - 41 U/L 15 19 18   ALT 0 - 44 U/L 12 16 15      Imaging studies:   CT Abdomen/Pelvis (07/11/2020) personally reviewed which shows pan-colonic diverticulosis, question of early inflammatory changes in the sigmoid  colon, no perforation, pneumatosis, nor abscess seen, and radiologist report reviewed below:  IMPRESSION: 1. Suspect early sigmoid diverticulitis. Sigmoid colon diverticulosis is extensive. 2. Otherwise stable from 2021 comparison, as above.    Assessment/Plan: (ICD-10's: R19.7) 75 y.o. female presenting with crampy abdominal discomfort and bloody diarrhea, concerning for possible ischemic colitis vs diverticulitis   - She is certainly non-toxic and without evidence of peritonitis. I certainly do not think she warrants any emergent nor urgent surgical interventions.    - I spent significant time with the patient and her husband at bedside discussing role for potential surgical intervention given the frequency and recurrence of these episodes of either ischemic colitis and/or diverticulitis. On review of her chart, it appears her left colon is most effected by ischemic changes based on most recent colonoscopy in April 2021. Additionally, she was seen to have IMA stenosis on [place] around that same time which would be consistent with this. I do agree this seems like the most likely etiology of her symptoms. Given this, there may be a role for partial left colectomy and anastomosis. However, she does appear to have relatively significant pan-colonic diverticulosis, so left colectomy would not be 100% preventative for future episodes of diverticulitis. I discussed potential role for elective subtotal colectomy with ileorectal anastomosis vs ileostomy and risks associated with that as well. I also discussed potential for forgoing all surgery and risks/benefits of this as well. After length discussion and review of all options, she and her husband seemed pretty understanding but hesitant, which is understandable. At this time, I again do not think she needs any urgent intervention, and we will arrange for follow up as an outpatient to discuss this further  All of the above findings and recommendations were  discussed with the patient and her family (husband at bedside), and all of their questions were answered to their expressed satisfaction.  Thank you for the opportunity to participate in this patient's care.   -- Edison Simon, PA-C [facility] 07/12/2020, 8:10 AM [phone] M-F: 7am - 4pm   The patient was discharged prior to my evaluation, however I discussed her case in detail and also reviewed her imaging with Mr. Olean Ree. I concur with his documentation above.

## 2020-07-12 NOTE — Discharge Summary (Signed)
Physician Discharge Summary  Kerry Jefferson:[id number] DOB: 1945-09-05 DOA: 07/11/2020  PCP: Idelle Crouch, MD  Admit date: 07/11/2020 Discharge date: 07/12/2020  Admitted From: Home Disposition:  Home  Recommendations for Outpatient Follow-up:  1. Follow up with PCP in 1-2 weeks 2. Please obtain BMP/CBC in one week your next doctors visit.  3. Follow up outpatient Gi and General Surgery   Discharge Condition: Stable CODE STATUS: Full  Diet recommendation: 2g Na  Brief/Interim Summary: 75 year old with history of diverticular bleed, HTN, GERD, hyperlipidemia comes to the hospital with complaints of abdominal pain found to have acute early sigmoid diverticulitis with extensive diverticulosis.  General surgery and GI were consulted.  Antibiotics were discontinued by GI.  Advance diet as tolerated, IV fluids.  Patient was seen by general surgery.  It was suspected her symptoms were secondary to ischemic colitis therefore no need for antibiotics.  It was determined that she will need to follow-up outpatient with GI and general surgery and possibly discuss future plans for colectomy or subcolectomy. Today she is tolerating oral diet and stable for discharge with outpatient follow-up as recommended above.  For rest of the details please refer to progress note from today.  Body mass index is 30.79 kg/m.         Discharge Diagnoses:  Principal Problem:   Acute diverticulitis Active Problems:   Benign essential HTN   HLD (hyperlipidemia)   Subjective: Feels ok, wants to go home   Discharge Exam: Vitals:   07/12/20 0718 07/12/20 1119  BP: (!) 146/63 (!) 165/81  Pulse: (!) 58 62  Resp: 15 18  Temp: 98.4 F (36.9 C) 99.1 F (37.3 C)  SpO2: 94% 93%   Vitals:   07/11/20 2014 07/12/20 0454 07/12/20 0718 07/12/20 1119  BP: (!) 144/69 136/66 (!) 146/63 (!) 165/81  Pulse: (!) 55 (!) 52 (!) 58 62  Resp: 18 18 15 18   Temp: 98.3 F (36.8 C) 98.2 F (36.8 C) 98.4 F  (36.9 C) 99.1 F (37.3 C)  TempSrc: Oral Oral  Oral  SpO2: 96% 97% 94% 93%  Weight:      Height:        General: Pt is alert, awake, not in acute distress Cardiovascular: RRR, S1/S2 +, no rubs, no gallops Respiratory: CTA bilaterally, no wheezing, no rhonchi Abdominal: Soft, NT, ND, bowel sounds + Extremities: no edema, no cyanosis  Discharge Instructions   Allergies as of 07/12/2020      Reactions   Amlodipine Other (See Comments)   Per patient   Atenolol Other (See Comments)   fatigue   Ciprofloxacin Other (See Comments)   Caused drug induced hepatitis / c-diff   Ciprofloxacin Hcl Other (See Comments)   Hepatitis   Lisinopril Other (See Comments)   Fatigue   Moxifloxacin Other (See Comments)   Hepatitis   Sertraline Other (See Comments)      Medication List    TAKE these medications   Calcium Carbonate-Vitamin D 600-200 MG-UNIT Tabs Take 1 tablet by mouth daily.   cholecalciferol 25 MCG (1000 UNIT) tablet Commonly known as: VITAMIN D Take 2,000 Units by mouth daily.   cloNIDine 0.1 MG tablet Commonly known as: CATAPRES Take 0.1 mg by mouth 2 (two) times daily as needed.   COQ-10 PO Take 1 tablet by mouth daily.   desonide 0.05 % cream Commonly known as: DESOWEN   fluticasone 50 MCG/ACT nasal spray Commonly known as: FLONASE USE TWO SPRAY(S) IN EACH NOSTRIL ONCE DAILY.  folic acid 832 MCG tablet Commonly known as: FOLVITE Take by mouth.   ketoconazole 2 % cream Commonly known as: NIZORAL   olmesartan-hydrochlorothiazide 40-12.5 MG tablet Commonly known as: BENICAR HCT Take 1 tablet by mouth daily.   omeprazole 40 MG capsule Commonly known as: PRILOSEC Take 40 mg by mouth daily as needed.   rosuvastatin 5 MG tablet Commonly known as: CRESTOR Take 5 mg by mouth daily.   Toprol XL 100 MG 24 hr tablet Generic drug: metoprolol succinate Take 100 mg by mouth daily.   traZODone 50 MG tablet Commonly known as: DESYREL Take 150 mg by mouth  at bedtime as needed.       Follow-up Information    Idelle Crouch, MD. Schedule an appointment as soon as possible for a visit in 1 week(s).   Specialty: Internal Medicine Contact information: [facility] [place] [address] [phone]        Efrain Sella, MD. Schedule an appointment as soon as possible for a visit in 1 week(s).   Specialty: Gastroenterology Contact information: [place] [address] [phone]        Fredirick Maudlin, MD. Schedule an appointment as soon as possible for a visit in 2 week(s).   Specialty: General Surgery Why: follow up hospitalization for colitis  Contact information: [address] [place] [address] [phone]              Allergies  Allergen Reactions  . Amlodipine Other (See Comments)    Per patient  . Atenolol Other (See Comments)    fatigue  . Ciprofloxacin Other (See Comments)    Caused drug induced hepatitis / c-diff  . Ciprofloxacin Hcl Other (See Comments)    Hepatitis  . Lisinopril Other (See Comments)    Fatigue  . Moxifloxacin Other (See Comments)    Hepatitis  . Sertraline Other (See Comments)    You were cared for by a hospitalist during your hospital stay. If you have any questions about your discharge medications or the care you received while you were in the hospital after you are discharged, you can call the unit and asked to speak with the hospitalist on call if the hospitalist that took care of you is not available. Once you are discharged, your primary care physician will handle any further medical issues. Please note that no refills for any discharge medications will be authorized once you are discharged, as it is imperative that you return to your primary care physician (or establish a relationship with a primary care physician if you do not have one) for your aftercare needs so that they can reassess your need for  medications and monitor your lab values.   Procedures/Studies: CT ABDOMEN PELVIS W CONTRAST  Result Date: 07/11/2020 CLINICAL DATA:  Abdominal pain and diarrhea EXAM: CT ABDOMEN AND PELVIS WITH CONTRAST TECHNIQUE: Multidetector CT imaging of the abdomen and pelvis was performed using the standard protocol following bolus administration of intravenous contrast. CONTRAST:  175m OMNIPAQUE IOHEXOL 300 MG/ML  SOLN COMPARISON:  12/23/2019 FINDINGS: Lower chest:  No contributory findings. Hepatobiliary: No focal liver abnormality.Cholecystectomy. No bile duct dilatation. Pancreas: Unremarkable. Spleen: Unremarkable. Adrenals/Urinary Tract: Negative adrenals. No hydronephrosis or stone. Unremarkable bladder. Stomach/Bowel: Extensive distal colonic diverticulosis. Mild adjacent fat inflammation is seen at the level of the sigmoid colon but there also appears to be a segment of focal submucosal low-density thickening. Vascular/Lymphatic: No acute vascular abnormality. Atheromatous calcification. No mass or  adenopathy. Reproductive:Hysterectomy. Long-standing bilateral ovarian cysts noted since a 2013 ultrasound report. Two cysts seen on the right, the larger measuring 3.2 cm, unchanged from prior. More posterior 13 mm right ovarian cyst is unchanged. Unchanged small left ovarian cyst. Other: No ascites or pneumoperitoneum. Musculoskeletal: No acute abnormalities. IMPRESSION: 1. Suspect early sigmoid diverticulitis. Sigmoid colon diverticulosis is extensive. 2. Otherwise stable from 2021 comparison, as above. Aortic Atherosclerosis (ICD10-I70.0). Electronically Signed   By: Monte Fantasia M.D.   On: 07/11/2020 10:12      The results of significant diagnostics from this hospitalization (including imaging, microbiology, ancillary and laboratory) are listed below for reference.     Microbiology: Recent Results (from the past 240 hour(s))  C Difficile Quick Screen w PCR reflex     Status: None   Collection Time:  07/11/20 10:44 AM   Specimen: Stool  Result Value Ref Range Status   C Diff antigen NEGATIVE NEGATIVE Final   C Diff toxin NEGATIVE NEGATIVE Final   C Diff interpretation No C. difficile detected.  Final    Comment: Performed at [facility], [address] [place], [place] [address]  Gastrointestinal Panel by PCR , Stool     Status: None   Collection Time: 07/11/20 10:44 AM   Specimen: Stool  Result Value Ref Range Status   Campylobacter species NOT DETECTED NOT DETECTED Final   Plesimonas shigelloides NOT DETECTED NOT DETECTED Final   Salmonella species NOT DETECTED NOT DETECTED Final   Yersinia enterocolitica NOT DETECTED NOT DETECTED Final   Vibrio species NOT DETECTED NOT DETECTED Final   Vibrio cholerae NOT DETECTED NOT DETECTED Final   Enteroaggregative E coli (EAEC) NOT DETECTED NOT DETECTED Final   Enteropathogenic E coli (EPEC) NOT DETECTED NOT DETECTED Final   Enterotoxigenic E coli (ETEC) NOT DETECTED NOT DETECTED Final   Shiga like toxin producing E coli (STEC) NOT DETECTED NOT DETECTED Final   Shigella/Enteroinvasive E coli (EIEC) NOT DETECTED NOT DETECTED Final   Cryptosporidium NOT DETECTED NOT DETECTED Final   Cyclospora cayetanensis NOT DETECTED NOT DETECTED Final   Entamoeba histolytica NOT DETECTED NOT DETECTED Final   Giardia lamblia NOT DETECTED NOT DETECTED Final   Adenovirus F40/41 NOT DETECTED NOT DETECTED Final   Astrovirus NOT DETECTED NOT DETECTED Final   Norovirus GI/GII NOT DETECTED NOT DETECTED Final   Rotavirus A NOT DETECTED NOT DETECTED Final   Sapovirus (I, II, IV, and V) NOT DETECTED NOT DETECTED Final    Comment: Performed at [facility], [address] [place], [place] [address]  SARS CORONAVIRUS 2 (TAT 6-24 HRS) Nasopharyngeal Nasopharyngeal Swab     Status: None   Collection Time: 07/11/20 11:18 AM   Specimen: Nasopharyngeal Swab  Result Value Ref Range Status   SARS Coronavirus 2 NEGATIVE NEGATIVE Final    Comment:  (NOTE) SARS-CoV-2 target nucleic acids are NOT DETECTED.  The SARS-CoV-2 RNA is generally detectable in upper and lower respiratory specimens during the acute phase of infection. Negative results do not preclude SARS-CoV-2 infection, do not rule out co-infections with other pathogens, and should not be used as the sole basis for treatment or other patient management decisions. Negative results must be combined with clinical observations, patient history, and epidemiological information. The expected result is Negative.  Fact Sheet for Patients: SugarRoll.be  Fact Sheet for Healthcare Providers: https://www.woods-mathews.com/  This test is not yet approved or cleared by the [place] FDA and  has been authorized for detection and/or diagnosis of SARS-CoV-2 by FDA  under an Emergency Use Authorization (EUA). This EUA will remain  in effect (meaning this test can be used) for the duration of the COVID-19 declaration under Se ction 564(b)(1) of the Act, 21 U.S.C. section 360bbb-3(b)(1), unless the authorization is terminated or revoked sooner.  Performed at [facility], [place] [address] [place], [place] [address]      Labs: BNP (last 3 results) No results for input(s): BNP in the last 8760 hours. Basic Metabolic Panel: Recent Labs  Lab 07/11/20 0738 07/12/20 0520  NA 136 134*  K 3.8 3.4*  CL 97* 102  CO2 29 26  GLUCOSE 116* 90  BUN 13 10  CREATININE 0.81 0.78  CALCIUM 9.2 8.1*   Liver Function Tests: Recent Labs  Lab 07/11/20 0738 07/12/20 0520  AST 19 15  ALT 16 12  ALKPHOS 60 43  BILITOT 0.6 0.8  PROT 7.0 5.7*  ALBUMIN 4.2 3.4*   Recent Labs  Lab 07/11/20 0738  LIPASE 65*   No results for input(s): AMMONIA in the last 168 hours. CBC: Recent Labs  Lab 07/11/20 0738 07/11/20 1715 07/12/20 0520  WBC 12.1*  --   --   HGB 13.7 12.0 11.3*  HCT 41.9 36.2 34.4*  MCV 88.2  --   --   PLT 215  --   --     Cardiac Enzymes: No results for input(s): CKTOTAL, CKMB, CKMBINDEX, TROPONINI in the last 168 hours. BNP: Invalid input(s): POCBNP CBG: No results for input(s): GLUCAP in the last 168 hours. D-Dimer No results for input(s): DDIMER in the last 72 hours. Hgb A1c No results for input(s): HGBA1C in the last 72 hours. Lipid Profile No results for input(s): CHOL, HDL, LDLCALC, TRIG, CHOLHDL, LDLDIRECT in the last 72 hours. Thyroid function studies No results for input(s): TSH, T4TOTAL, T3FREE, THYROIDAB in the last 72 hours.  Invalid input(s): FREET3 Anemia work up No results for input(s): VITAMINB12, FOLATE, FERRITIN, TIBC, IRON, RETICCTPCT in the last 72 hours. Urinalysis    Component Value Date/Time   COLORURINE YELLOW (A) 07/11/2020 1100   APPEARANCEUR CLEAR (A) 07/11/2020 1100   LABSPEC 1.021 07/11/2020 1100   PHURINE 6.0 07/11/2020 1100   GLUCOSEU NEGATIVE 07/11/2020 1100   HGBUR NEGATIVE 07/11/2020 1100   BILIRUBINUR NEGATIVE 07/11/2020 1100   KETONESUR NEGATIVE 07/11/2020 1100   PROTEINUR NEGATIVE 07/11/2020 1100   NITRITE NEGATIVE 07/11/2020 1100   LEUKOCYTESUR TRACE (A) 07/11/2020 1100   Sepsis Labs Invalid input(s): PROCALCITONIN,  WBC,  LACTICIDVEN Microbiology Recent Results (from the past 240 hour(s))  C Difficile Quick Screen w PCR reflex     Status: None   Collection Time: 07/11/20 10:44 AM   Specimen: Stool  Result Value Ref Range Status   C Diff antigen NEGATIVE NEGATIVE Final   C Diff toxin NEGATIVE NEGATIVE Final   C Diff interpretation No C. difficile detected.  Final    Comment: Performed at [facility], [address] [place],  [address]  Gastrointestinal Panel by PCR , Stool     Status: None   Collection Time: 07/11/20 10:44 AM   Specimen: Stool  Result Value Ref Range Status   Campylobacter species NOT DETECTED NOT DETECTED Final   Plesimonas shigelloides NOT DETECTED NOT DETECTED Final   Salmonella species NOT DETECTED NOT  DETECTED Final   Yersinia enterocolitica NOT DETECTED NOT DETECTED Final   Vibrio species NOT DETECTED NOT DETECTED Final   Vibrio cholerae NOT DETECTED NOT DETECTED Final   Enteroaggregative E coli (  EAEC) NOT DETECTED NOT DETECTED Final   Enteropathogenic E coli (EPEC) NOT DETECTED NOT DETECTED Final   Enterotoxigenic E coli (ETEC) NOT DETECTED NOT DETECTED Final   Shiga like toxin producing E coli (STEC) NOT DETECTED NOT DETECTED Final   Shigella/Enteroinvasive E coli (EIEC) NOT DETECTED NOT DETECTED Final   Cryptosporidium NOT DETECTED NOT DETECTED Final   Cyclospora cayetanensis NOT DETECTED NOT DETECTED Final   Entamoeba histolytica NOT DETECTED NOT DETECTED Final   Giardia lamblia NOT DETECTED NOT DETECTED Final   Adenovirus F40/41 NOT DETECTED NOT DETECTED Final   Astrovirus NOT DETECTED NOT DETECTED Final   Norovirus GI/GII NOT DETECTED NOT DETECTED Final   Rotavirus A NOT DETECTED NOT DETECTED Final   Sapovirus (I, II, IV, and V) NOT DETECTED NOT DETECTED Final    Comment: Performed at [facility], [address] [place], [place] [address]  SARS CORONAVIRUS 2 (TAT 6-24 HRS) Nasopharyngeal Nasopharyngeal Swab     Status: None   Collection Time: 07/11/20 11:18 AM   Specimen: Nasopharyngeal Swab  Result Value Ref Range Status   SARS Coronavirus 2 NEGATIVE NEGATIVE Final    Comment: (NOTE) SARS-CoV-2 target nucleic acids are NOT DETECTED.  The SARS-CoV-2 RNA is generally detectable in upper and lower respiratory specimens during the acute phase of infection. Negative results do not preclude SARS-CoV-2 infection, do not rule out co-infections with other pathogens, and should not be used as the sole basis for treatment or other patient management decisions. Negative results must be combined with clinical observations, patient history, and epidemiological information. The expected result is Negative.  Fact Sheet for  Patients: SugarRoll.be  Fact Sheet for Healthcare Providers: https://www.woods-mathews.com/  This test is not yet approved or cleared by the [place] FDA and  has been authorized for detection and/or diagnosis of SARS-CoV-2 by FDA under an Emergency Use Authorization (EUA). This EUA will remain  in effect (meaning this test can be used) for the duration of the COVID-19 declaration under Se ction 564(b)(1) of the Act, 21 U.S.C. section 360bbb-3(b)(1), unless the authorization is terminated or revoked sooner.  Performed at [facility], [place] [address] [place], [place] [address]      Time coordinating discharge:  I have spent 35 minutes face to face with the patient and on the ward discussing the patients care, assessment, plan and disposition with other care givers. >50% of the time was devoted counseling the patient about the risks and benefits of treatment/Discharge disposition and coordinating care.   SIGNED:   Damita Lack, MD  [facility] 07/12/2020, 5:04 PM   If 7PM-7AM, please contact night-coverage

## 2020-07-12 NOTE — Discharge Instructions (Signed)

## 2020-07-12 NOTE — Progress Notes (Signed)
PROGRESS NOTE    Kerry Jefferson  HUD:[id number] DOB: [birth year] DOA: 07/11/2020 PCP: Idelle Crouch, MD   Brief Narrative:  75 year old with history of diverticular bleed, HTN, GERD, hyperlipidemia comes to the hospital with complaints of abdominal pain found to have acute early sigmoid diverticulitis with extensive diverticulosis.  General surgery and GI were consulted.  Antibiotics were discontinued by GI.  Advance diet as tolerated, IV fluids.   Assessment & Plan:   Principal Problem:   Acute diverticulitis Active Problems:   Benign essential HTN   HLD (hyperlipidemia)  Acute uncomplicated diverticulitis, sigmoid -Has known extensive diverticular disease.  Antibiotics discontinued.  GI following.  General surgery consulted for their input -Advance diet as tolerated.  IV fluids.  Hypokalemia -Repletion ordered  Hypertension Continue Toprol, HCTZ and Avapro  Depression Continue Lexapro  Dyslipidemia Continue statins   DVT prophylaxis: SCDs Start: 07/11/20 1118  Code Status: Full code Family Communication: None    Dispo: The patient is from: Home              Anticipated d/c is to: Home              Anticipated d/c date is: 1 day              Patient currently is not medically stable to d/c.  Pending GI and general surgery clearance.  In the meantime requires IV fluids, advancing her diet slowly.   Difficult to place patient No    Subjective: Still has slight lower abdominal pain but much improved from yesterday.  No other complaints at this time.  Review of Systems Otherwise negative except as per HPI, including: General: Denies fever, chills, night sweats or unintended weight loss. Resp: Denies cough, wheezing, shortness of breath. Cardiac: Denies chest pain, palpitations, orthopnea, paroxysmal nocturnal dyspnea. GI: Denies abdominal pain, nausea, vomiting, diarrhea or constipation GU: Denies dysuria, frequency, hesitancy or incontinence MS:  Denies muscle aches, joint pain or swelling Neuro: Denies headache, neurologic deficits (focal weakness, numbness, tingling), abnormal gait Psych: Denies anxiety, depression, SI/HI/AVH Skin: Denies new rashes or lesions ID: Denies sick contacts, exotic exposures, travel  Examination:  General exam: Appears calm and comfortable  Respiratory system: Clear to auscultation. Respiratory effort normal. Cardiovascular system: S1 & S2 heard, RRR. No JVD, murmurs, rubs, gallops or clicks. No pedal edema. Gastrointestinal system: Abdomen is nondistended, soft and nontender. No organomegaly or masses felt. Normal bowel sounds heard. Central nervous system: Alert and oriented. No focal neurological deficits. Extremities: Symmetric 5 x 5 power. Skin: No rashes, lesions or ulcers Psychiatry: Judgement and insight appear normal. Mood & affect appropriate.     Objective: Vitals:   07/11/20 1900 07/11/20 2014 07/12/20 0454 07/12/20 0718  BP: (!) 148/101 (!) 144/69 136/66 (!) 146/63  Pulse: (!) 102 (!) 55 (!) 52 (!) 58  Resp: 15 18 18 15   Temp:  98.3 F (36.8 C) 98.2 F (36.8 C) 98.4 F (36.9 C)  TempSrc:  Oral Oral   SpO2: 99% 96% 97% 94%  Weight:      Height:        Intake/Output Summary (Last 24 hours) at 07/12/2020 1059 Last data filed at 07/12/2020 1008 Gross per 24 hour  Intake 1901.74 ml  Output -  Net 1901.74 ml   Filed Weights   07/11/20 0729  Weight: 83.9 kg     Data Reviewed:   CBC: Recent Labs  Lab 07/11/20 0738 07/11/20 1715 07/12/20 0520  WBC 12.1*  --   --  HGB 13.7 12.0 11.3*  HCT 41.9 36.2 34.4*  MCV 88.2  --   --   PLT 215  --   --    Basic Metabolic Panel: Recent Labs  Lab 07/11/20 0738 07/12/20 0520  NA 136 134*  K 3.8 3.4*  CL 97* 102  CO2 29 26  GLUCOSE 116* 90  BUN 13 10  CREATININE 0.81 0.78  CALCIUM 9.2 8.1*   GFR: Estimated Creatinine Clearance: 64 mL/min (by C-G formula based on SCr of 0.78 mg/dL). Liver Function Tests: Recent  Labs  Lab 07/11/20 0738 07/12/20 0520  AST 19 15  ALT 16 12  ALKPHOS 60 43  BILITOT 0.6 0.8  PROT 7.0 5.7*  ALBUMIN 4.2 3.4*   Recent Labs  Lab 07/11/20 0738  LIPASE 65*   No results for input(s): AMMONIA in the last 168 hours. Coagulation Profile: No results for input(s): INR, PROTIME in the last 168 hours. Cardiac Enzymes: No results for input(s): CKTOTAL, CKMB, CKMBINDEX, TROPONINI in the last 168 hours. BNP (last 3 results) No results for input(s): PROBNP in the last 8760 hours. HbA1C: No results for input(s): HGBA1C in the last 72 hours. CBG: No results for input(s): GLUCAP in the last 168 hours. Lipid Profile: No results for input(s): CHOL, HDL, LDLCALC, TRIG, CHOLHDL, LDLDIRECT in the last 72 hours. Thyroid Function Tests: No results for input(s): TSH, T4TOTAL, FREET4, T3FREE, THYROIDAB in the last 72 hours. Anemia Panel: No results for input(s): VITAMINB12, FOLATE, FERRITIN, TIBC, IRON, RETICCTPCT in the last 72 hours. Sepsis Labs: No results for input(s): PROCALCITON, LATICACIDVEN in the last 168 hours.  Recent Results (from the past 240 hour(s))  C Difficile Quick Screen w PCR reflex     Status: None   Collection Time: 07/11/20 10:44 AM   Specimen: Stool  Result Value Ref Range Status   C Diff antigen NEGATIVE NEGATIVE Final   C Diff toxin NEGATIVE NEGATIVE Final   C Diff interpretation No C. difficile detected.  Final    Comment: Performed at [facility], [address] [place], [place] [address]  Gastrointestinal Panel by PCR , Stool     Status: None   Collection Time: 07/11/20 10:44 AM   Specimen: Stool  Result Value Ref Range Status   Campylobacter species NOT DETECTED NOT DETECTED Final   Plesimonas shigelloides NOT DETECTED NOT DETECTED Final   Salmonella species NOT DETECTED NOT DETECTED Final   Yersinia enterocolitica NOT DETECTED NOT DETECTED Final   Vibrio species NOT DETECTED NOT DETECTED Final   Vibrio cholerae NOT DETECTED NOT  DETECTED Final   Enteroaggregative E coli (EAEC) NOT DETECTED NOT DETECTED Final   Enteropathogenic E coli (EPEC) NOT DETECTED NOT DETECTED Final   Enterotoxigenic E coli (ETEC) NOT DETECTED NOT DETECTED Final   Shiga like toxin producing E coli (STEC) NOT DETECTED NOT DETECTED Final   Shigella/Enteroinvasive E coli (EIEC) NOT DETECTED NOT DETECTED Final   Cryptosporidium NOT DETECTED NOT DETECTED Final   Cyclospora cayetanensis NOT DETECTED NOT DETECTED Final   Entamoeba histolytica NOT DETECTED NOT DETECTED Final   Giardia lamblia NOT DETECTED NOT DETECTED Final   Adenovirus F40/41 NOT DETECTED NOT DETECTED Final   Astrovirus NOT DETECTED NOT DETECTED Final   Norovirus GI/GII NOT DETECTED NOT DETECTED Final   Rotavirus A NOT DETECTED NOT DETECTED Final   Sapovirus (I, II, IV, and V) NOT DETECTED NOT DETECTED Final    Comment: Performed at [facility], [address] [place], [place] [address]  SARS CORONAVIRUS 2 (TAT 6-24 HRS) Nasopharyngeal Nasopharyngeal Swab     Status: None   Collection Time: 07/11/20 11:18 AM   Specimen: Nasopharyngeal Swab  Result Value Ref Range Status   SARS Coronavirus 2 NEGATIVE NEGATIVE Final    Comment: (NOTE) SARS-CoV-2 target nucleic acids are NOT DETECTED.  The SARS-CoV-2 RNA is generally detectable in upper and lower respiratory specimens during the acute phase of infection. Negative results do not preclude SARS-CoV-2 infection, do not rule out co-infections with other pathogens, and should not be used as the sole basis for treatment or other patient management decisions. Negative results must be combined with clinical observations, patient history, and epidemiological information. The expected result is Negative.  Fact Sheet for Patients: SugarRoll.be  Fact Sheet for Healthcare Providers: https://www.woods-mathews.com/  This test is not yet approved or cleared by the [place] FDA and   has been authorized for detection and/or diagnosis of SARS-CoV-2 by FDA under an Emergency Use Authorization (EUA). This EUA will remain  in effect (meaning this test can be used) for the duration of the COVID-19 declaration under Se ction 564(b)(1) of the Act, 21 U.S.C. section 360bbb-3(b)(1), unless the authorization is terminated or revoked sooner.  Performed at [facility], [place] [address] [place],  [address]          Radiology Studies: CT ABDOMEN PELVIS W CONTRAST  Result Date: 07/11/2020 CLINICAL DATA:  Abdominal pain and diarrhea EXAM: CT ABDOMEN AND PELVIS WITH CONTRAST TECHNIQUE: Multidetector CT imaging of the abdomen and pelvis was performed using the standard protocol following bolus administration of intravenous contrast. CONTRAST:  169m OMNIPAQUE IOHEXOL 300 MG/ML  SOLN COMPARISON:  12/23/2019 FINDINGS: Lower chest:  No contributory findings. Hepatobiliary: No focal liver abnormality.Cholecystectomy. No bile duct dilatation. Pancreas: Unremarkable. Spleen: Unremarkable. Adrenals/Urinary Tract: Negative adrenals. No hydronephrosis or stone. Unremarkable bladder. Stomach/Bowel: Extensive distal colonic diverticulosis. Mild adjacent fat inflammation is seen at the level of the sigmoid colon but there also appears to be a segment of focal submucosal low-density thickening. Vascular/Lymphatic: No acute vascular abnormality. Atheromatous calcification. No mass or adenopathy. Reproductive:Hysterectomy. Long-standing bilateral ovarian cysts noted since a 2013 ultrasound report. Two cysts seen on the right, the larger measuring 3.2 cm, unchanged from prior. More posterior 13 mm right ovarian cyst is unchanged. Unchanged small left ovarian cyst. Other: No ascites or pneumoperitoneum. Musculoskeletal: No acute abnormalities. IMPRESSION: 1. Suspect early sigmoid diverticulitis. Sigmoid colon diverticulosis is extensive. 2. Otherwise stable from 2021 comparison, as above. Aortic  Atherosclerosis (ICD10-I70.0). Electronically Signed   By: JMonte FantasiaM.D.   On: 07/11/2020 10:12        Scheduled Meds: . atorvastatin  40 mg Oral Daily  . cholecalciferol  2,000 Units Oral Daily  . escitalopram  10 mg Oral Daily  . fluticasone  1 spray Each Nare Daily  . folic acid  5976mcg Oral Daily  . irbesartan  300 mg Oral Daily   And  . hydrochlorothiazide  12.5 mg Oral Daily  . methylPREDNISolone (SOLU-MEDROL) injection  40 mg Intravenous Q8H  . metoprolol succinate  100 mg Oral Daily  . pantoprazole  40 mg Oral Daily   Continuous Infusions: . sodium chloride 100 mL/hr at 07/12/20 0917     LOS: 0 days   Time spent= 25 mins    Darivs Lunden CArsenio Loader MD [facility]  If 7PM-7AM, please contact night-coverage  07/12/2020, 10:59 AM

## 2020-07-12 NOTE — Progress Notes (Signed)
Patient resting in bed. Has not complained of any pain or distress. Did have fluids infusing but patient has been discharged. Discontinued IV infusion and gave discharge instructions. She stated her ride had an appointment and would come to pick her up around 2pm. Waiting in bed comfortably with call bell in reach, bed in low position.

## 2020-07-12 NOTE — Care Management Obs Status (Signed)
[facility] NOTIFICATION   Patient Details  Name: Kerry Jefferson MRN: [id number] Date of Birth: 01-Apr-1946   Medicare Observation Status Notification Given:  Yes    Shelbie Hutching, RN 07/12/2020, 9:26 AM

## 2020-07-12 NOTE — TOC Initial Note (Signed)
Transition of Care [facility]) - Initial/Assessment Note    Patient Details  Name: Kerry Jefferson MRN: [id number] Date of Birth: 1946/05/09  [facility] of [facility]) CM/SW Contact:    Shelbie Hutching, RN Phone Number: 07/12/2020, 9:32 AM  Clinical Narrative:                 Patient placed under observation for acute diverticulitis.  RNCM met with patient at the bedside to review MOON.  Patient is from home with her husband, is independent, drives, and current with her PCP.  No discharge needs identified.   Expected Discharge Plan: Home/Self Care Barriers to Discharge: Continued Medical Work up   Patient Goals and CMS Choice Patient states their goals for this hospitalization and ongoing recovery are:: To not be discharged to early but to feel better and get back home      Expected Discharge Plan and Services Expected Discharge Plan: Home/Self Care       Living arrangements for the past 2 months: Single Family Home                                      Prior Living Arrangements/Services Living arrangements for the past 2 months: Single Family Home Lives with:: Spouse Patient language and need for interpreter reviewed:: Yes Do you feel safe going back to the place where you live?: Yes      Need for Family Participation in Patient Care: No (Comment) Care giver support system in place?: Yes (comment)   Criminal Activity/Legal Involvement Pertinent to Current Situation/Hospitalization: No - Comment as needed  Activities of Daily Living Home Assistive Devices/Equipment: None ADL Screening (condition at time of admission) Patient's cognitive ability adequate to safely complete daily activities?: Yes Is the patient deaf or have difficulty hearing?: No Does the patient have difficulty seeing, even when wearing glasses/contacts?: No Does the patient have difficulty concentrating, remembering, or making decisions?: No Patient able to express need for assistance with ADLs?:  Yes Does the patient have difficulty dressing or bathing?: No Independently performs ADLs?: Yes (appropriate for developmental age) Does the patient have difficulty walking or climbing stairs?: No Weakness of Legs: None Weakness of Arms/Hands: None  Permission Sought/Granted                  Emotional Assessment Appearance:: Appears stated age Attitude/Demeanor/Rapport: Engaged Affect (typically observed): Accepting Orientation: : Oriented to Self,Oriented to Place,Oriented to  Time,Oriented to Situation Alcohol / Substance Use: Not Applicable Psych Involvement: No (comment)  Admission diagnosis:  Diverticulitis [K57.92] Bloody diarrhea [R19.7] Acute diverticulitis [K57.92] Patient Active Problem List   Diagnosis Date Noted  . Acute diverticulitis 07/11/2020  . AKI (acute kidney injury) ([place]) 09/01/2019  . Hypokalemia 09/01/2019  . Colitis 08/31/2019  . Heart palpitations 12/25/2017  . Allergic rhinitis 08/20/2015  . Colon polyp 08/20/2015  . Cyst of right ovary 08/20/2015  . Endometriosis 08/20/2015  . Acid reflux 08/20/2015  . History of colitis 08/20/2015  . H/O viral illness 08/20/2015  . Osteopenia 08/20/2015  . 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 HLD (hyperlipidemia) 07/21/2015  . APC (atrial premature contractions) 07/21/2014  . Essential (primary) hypertension 02/10/2014  . Facial numbness 12/30/2013  . Jerking 12/30/2013  . Arthritis, degenerative 11/17/2013  . Benign essential HTN 11/10/2013  . Cephalalgia 11/10/2013  . Feeling bilious 07/26/2011   PCP:  Idelle Crouch, MD Pharmacy:   [id number]  [place] [address] Phone: [phone] Fax: [phone]     Social Determinants of Health (SDOH) Interventions    Readmission Risk Interventions No flowsheet data found.

## 2020-08-04 ENCOUNTER — Other Ambulatory Visit (INDEPENDENT_AMBULATORY_CARE_PROVIDER_SITE_OTHER): Payer: Self-pay | Admitting: Vascular Surgery

## 2020-08-04 DIAGNOSIS — K551 Chronic vascular disorders of intestine: Secondary | ICD-10-CM | POA: Insufficient documentation

## 2020-08-04 NOTE — Progress Notes (Signed)
MRN : [id number]  Kerry Jefferson is a 75 y.o. (04-29-1946) female who presents with chief complaint of No chief complaint on file. Marland Kitchen  History of Present Illness:   I am asked to evaluate the patient for the complaint of abdominal pain with uncertain etiology.  The patient is followed by general surgery who is concerned about mesenteric ischemia.  She has a long history of diverticulosis with intermittent episodes of diverticulitis.  The majority of these have been in the sigmoid distribution.  Her most recent episode was associated with the concern for possible ischemic colitis.  No history of peptic ulcer disease.   No prior peripheral angiograms or vascular interventions.  CT abdomen with contrast was performed on July 11, 2020.  This is reported as normal vasculature.  The patient denies amaurosis fugax or recent TIA symptoms. There are no recent neurological changes noted. The patient denies claudication symptoms or rest pain symptoms. The patient denies history of DVT, PE or superficial thrombophlebitis. The patient denies recent episodes of angina    No outpatient medications have been marked as taking for the 08/05/20 encounter (Appointment) with Delana Meyer, Dolores Lory, MD.    Past Medical History:  Diagnosis Date  . Arthritis    hands, feet  . Cancer ([place])    skin  . GERD (gastroesophageal reflux disease)   . Hepatitis    "8-10 yrs ago". "brought on by antibiotics"  . Hyperlipidemia   . Hypertension   . Tendinitis   . Vertigo    1x/month    Past Surgical History:  Procedure Laterality Date  . ABDOMINAL HYSTERECTOMY    . BREAST BIOPSY Left 2003   negative  . CATARACT EXTRACTION W/PHACO Right 09/23/2019   Procedure: CATARACT EXTRACTION PHACO AND INTRAOCULAR LENS PLACEMENT (IOC) RIGHT 4.61  00:29.6;  Surgeon: Birder Robson, MD;  Location: [place];  Service: Ophthalmology;  Laterality: Right;  . CATARACT EXTRACTION W/PHACO Left 10/14/2019   Procedure:  CATARACT EXTRACTION PHACO AND INTRAOCULAR LENS PLACEMENT (IOC) LEFT 6.76  00:37.1;  Surgeon: Birder Robson, MD;  Location: [place];  Service: Ophthalmology;  Laterality: Left;  . CHOLECYSTECTOMY    . COLONOSCOPY WITH PROPOFOL N/A 10/22/2015   Procedure: COLONOSCOPY WITH PROPOFOL;  Surgeon: Manya Silvas, MD;  Location[facility] ENDOSCOPY;  Service: Endoscopy;  Laterality: N/A;  . COLONOSCOPY WITH PROPOFOL N/A 09/02/2019   Procedure: COLONOSCOPY WITH PROPOFOL;  Surgeon: Lin Landsman, MD;  Location: [facility] ENDOSCOPY;  Service: Gastroenterology;  Laterality: N/A;  . TONSILLECTOMY      Social History Social History   Tobacco Use  . Smoking status: Former Smoker    Quit date: 2003    Years since quitting: 19.2  . Smokeless tobacco: Never Used  . Tobacco comment: quit 10 years ago  Vaping Use  . Vaping Use: Never used  Substance Use Topics  . Alcohol use: Yes    Comment: occ.  . Drug use: No    Family History Family History  Problem Relation Age of Onset  . Breast cancer Maternal Grandmother 33  No family history of bleeding/clotting disorders, porphyria or autoimmune disease   Allergies  Allergen Reactions  . Amlodipine Other (See Comments)    Per patient  . Atenolol Other (See Comments)    fatigue  . Ciprofloxacin Other (See Comments)    Caused drug induced hepatitis / c-diff  . Ciprofloxacin Hcl Other (See Comments)    Hepatitis  . Lisinopril Other (See Comments)    Fatigue  .  Moxifloxacin Other (See Comments)    Hepatitis  . Sertraline Other (See Comments)     REVIEW OF SYSTEMS (Negative unless checked)  Constitutional: [] Weight loss  [] Fever  [] Chills Cardiac: [] Chest pain   [] Chest pressure   [] Palpitations   [] Shortness of breath when laying flat   [] Shortness of breath with exertion. Vascular:  [] Pain in legs with walking   [] Pain in legs at rest  [] History of DVT   [] Phlebitis   [] Swelling in legs   [] Varicose veins   [] Non-healing  ulcers Pulmonary:   [] Uses home oxygen   [] Productive cough   [] Hemoptysis   [] Wheeze  [] COPD   [] Asthma Neurologic:  [] Dizziness   [] Seizures   [] History of stroke   [] History of TIA  [] Aphasia   [] Vissual changes   [] Weakness or numbness in arm   [] Weakness or numbness in leg Musculoskeletal:   [] Joint swelling   [] Joint pain   [] Low back pain Hematologic:  [] Easy bruising  [] Easy bleeding   [] Hypercoagulable state   [] Anemic Gastrointestinal:  [] Diarrhea   [] Vomiting  [x] Gastroesophageal reflux/heartburn   [] Difficulty swallowing. Genitourinary:  [] Chronic kidney disease   [] Difficult urination  [] Frequent urination   [] Blood in urine Skin:  [] Rashes   [] Ulcers  Psychological:  [] History of anxiety   []  History of major depression.  Physical Examination  There were no vitals filed for this visit. There is no height or weight on file to calculate BMI. Gen: WD/WN, NAD Head: [place]/AT, No temporalis wasting.  Ear/Nose/Throat: Hearing grossly intact, nares w/o erythema or drainage, poor dentition Eyes: PER, EOMI, sclera nonicteric.  Neck: Supple, no masses.  No bruit or JVD.  Pulmonary:  Good air movement, clear to auscultation bilaterally, no use of accessory muscles.  Cardiac: RRR, normal S1, S2, no Murmurs. Vascular:  Vessel Right Left  Radial Palpable Palpable  Gastrointestinal: soft, non-distended. No guarding/no peritoneal signs.  Musculoskeletal: M/S 5/5 throughout.  No deformity or atrophy.  Neurologic: CN 2-12 intact. Pain and light touch intact in extremities.  Symmetrical.  Speech is fluent. Motor exam as listed above. Psychiatric: Judgment intact, Mood & affect appropriate for pt's clinical situation. Dermatologic: No rashes or ulcers noted.  No changes consistent with cellulitis.   CBC Lab Results  Component Value Date   WBC 12.1 (H) 07/11/2020   HGB 11.3 (L) 07/12/2020   HCT 34.4 (L) 07/12/2020   MCV 88.2 07/11/2020   PLT 215 07/11/2020    BMET    Component Value  Date/Time   NA 134 (L) 07/12/2020 0520   K 3.4 (L) 07/12/2020 0520   CL 102 07/12/2020 0520   CO2 26 07/12/2020 0520   GLUCOSE 90 07/12/2020 0520   BUN 10 07/12/2020 0520   CREATININE 0.78 07/12/2020 0520   CALCIUM 8.1 (L) 07/12/2020 0520   GFRNONAA >60 07/12/2020 0520   GFRAA >60 09/04/2019 0452   CrCl cannot be calculated (Patient's most recent lab result is older than the maximum 21 days allowed.).  COAG No results found for: INR, PROTIME  Radiology CT ABDOMEN PELVIS W CONTRAST  Result Date: 07/11/2020 CLINICAL DATA:  Abdominal pain and diarrhea EXAM: CT ABDOMEN AND PELVIS WITH CONTRAST TECHNIQUE: Multidetector CT imaging of the abdomen and pelvis was performed using the standard protocol following bolus administration of intravenous contrast. CONTRAST:  164m OMNIPAQUE IOHEXOL 300 MG/ML  SOLN COMPARISON:  12/23/2019 FINDINGS: Lower chest:  No contributory findings. Hepatobiliary: No focal liver abnormality.Cholecystectomy. No bile duct dilatation. Pancreas: Unremarkable. Spleen: Unremarkable. Adrenals/Urinary Tract: Negative adrenals.  No hydronephrosis or stone. Unremarkable bladder. Stomach/Bowel: Extensive distal colonic diverticulosis. Mild adjacent fat inflammation is seen at the level of the sigmoid colon but there also appears to be a segment of focal submucosal low-density thickening. Vascular/Lymphatic: No acute vascular abnormality. Atheromatous calcification. No mass or adenopathy. Reproductive:Hysterectomy. Long-standing bilateral ovarian cysts noted since a 2013 ultrasound report. Two cysts seen on the right, the larger measuring 3.2 cm, unchanged from prior. More posterior 13 mm right ovarian cyst is unchanged. Unchanged small left ovarian cyst. Other: No ascites or pneumoperitoneum. Musculoskeletal: No acute abnormalities. IMPRESSION: 1. Suspect early sigmoid diverticulitis. Sigmoid colon diverticulosis is extensive. 2. Otherwise stable from 2021 comparison, as above. Aortic  Atherosclerosis (ICD10-I70.0). Electronically Signed   By: Monte Fantasia M.D.   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  It shows no evidence for celiac or superior mesenteric artery stenosis.  Her celiac and SMA are widely patent as are her internal iliac arteries.  She does have some plaque at the origin of the IMA with a 60% stenosis but I would not intervene on this isolated lesions unless there were extraordinary circumstances.  If her sigmoid symptoms persist I would recommend elective sigmoid colectomy.  She will follow up with me PRN  2. Benign essential HTN Continue antihypertensive medications as already ordered, these medications have been reviewed and there are no changes at this time.   3. Gastroesophageal reflux disease without esophagitis Continue PPI as already ordered, this medication has been reviewed and there are no changes at this time.  Avoidence of caffeine and alcohol  Moderate elevation of the head of the bed   4. Hyperlipidemia, unspecified hyperlipidemia type Continue statin as ordered and reviewed, no changes at this time     Hortencia Pilar, MD  08/04/2020 9:02 PM

## 2020-08-05 ENCOUNTER — Ambulatory Visit (INDEPENDENT_AMBULATORY_CARE_PROVIDER_SITE_OTHER): Payer: Medicare Other | Admitting: Vascular Surgery

## 2020-08-05 ENCOUNTER — Encounter (INDEPENDENT_AMBULATORY_CARE_PROVIDER_SITE_OTHER): Payer: Self-pay | Admitting: Vascular Surgery

## 2020-08-05 ENCOUNTER — Ambulatory Visit (INDEPENDENT_AMBULATORY_CARE_PROVIDER_SITE_OTHER): Payer: Medicare Other

## 2020-08-05 ENCOUNTER — Other Ambulatory Visit: Payer: Self-pay

## 2020-08-05 VITALS — BP 171/73 | HR 64 | Resp 16 | Ht 65.5 in | Wt 178.8 lb

## 2020-08-05 DIAGNOSIS — K219 Gastro-esophageal reflux disease without esophagitis: Secondary | ICD-10-CM | POA: Diagnosis not present

## 2020-08-05 DIAGNOSIS — I10 Essential (primary) hypertension: Secondary | ICD-10-CM

## 2020-08-05 DIAGNOSIS — E785 Hyperlipidemia, unspecified: Secondary | ICD-10-CM | POA: Diagnosis not present

## 2020-08-05 DIAGNOSIS — K551 Chronic vascular disorders of intestine: Secondary | ICD-10-CM

## 2020-11-12 ENCOUNTER — Other Ambulatory Visit: Payer: Self-pay | Admitting: Internal Medicine

## 2020-11-12 DIAGNOSIS — Z1231 Encounter for screening mammogram for malignant neoplasm of breast: Secondary | ICD-10-CM

## 2020-12-20 ENCOUNTER — Other Ambulatory Visit: Payer: Self-pay

## 2020-12-20 ENCOUNTER — Ambulatory Visit
Admission: RE | Admit: 2020-12-20 | Discharge: 2020-12-20 | Disposition: A | Discharge status: Home / Self Care | Payer: Medicare Other | Source: Ambulatory Visit | Attending: Internal Medicine | Admitting: Internal Medicine

## 2020-12-20 DIAGNOSIS — Z1231 Encounter for screening mammogram for malignant neoplasm of breast: Secondary | ICD-10-CM | POA: Insufficient documentation

## 2021-03-31 ENCOUNTER — Ambulatory Visit: Payer: Medicare Other

## 2021-04-07 ENCOUNTER — Other Ambulatory Visit: Payer: Self-pay

## 2021-04-08 ENCOUNTER — Ambulatory Visit: Payer: Medicare Other | Attending: Internal Medicine

## 2021-04-08 DIAGNOSIS — Z23 Encounter for immunization: Secondary | ICD-10-CM

## 2021-04-08 NOTE — Progress Notes (Signed)
   Covid-19 Vaccination Clinic  Name:  Kerry Jefferson    MRN: [id number] DOB: 1946/01/08  04/08/2021  Ms. [name] was observed post Covid-19 immunization for 15 minutes without incident. She was provided with Vaccine Information Sheet and instruction to access the V-Safe system.   Ms. [name] was instructed to call 911 with any severe reactions post vaccine: Difficulty breathing  Swelling of face and throat  A fast heartbeat  A bad rash all over body  Dizziness and weakness   Immunizations Administered     Name Date Dose VIS Date Route   Pfizer Covid-19 Vaccine Bivalent Booster 04/08/2021  9:25 AM 0.3 mL 01/19/2021 Intramuscular   Manufacturer: [place]   Lot: [id number]   [place]: [place], PharmD, MBA Clinical Acute Care Pharmacist

## 2021-04-12 ENCOUNTER — Other Ambulatory Visit: Payer: Self-pay

## 2021-04-12 MED ORDER — PFIZER COVID-19 VAC BIVALENT 30 MCG/0.3ML IM SUSP
INTRAMUSCULAR | 0 refills | Status: AC
  Filled 2021-04-12: qty 0.3, 1d supply, fill #0

## 2021-06-17 ENCOUNTER — Other Ambulatory Visit: Payer: Self-pay

## 2021-06-17 DIAGNOSIS — Z79899 Other long term (current) drug therapy: Secondary | ICD-10-CM | POA: Diagnosis not present

## 2021-06-17 DIAGNOSIS — Z85828 Personal history of other malignant neoplasm of skin: Secondary | ICD-10-CM | POA: Insufficient documentation

## 2021-06-17 DIAGNOSIS — F419 Anxiety disorder, unspecified: Secondary | ICD-10-CM | POA: Diagnosis not present

## 2021-06-17 DIAGNOSIS — I10 Essential (primary) hypertension: Secondary | ICD-10-CM | POA: Diagnosis present

## 2021-06-17 NOTE — ED Triage Notes (Signed)
Pt states is here for high blood pressure. Pt states has taken clonidine x3 tonight, last dose at 2145. Pt states is also having chest pain.

## 2021-06-18 ENCOUNTER — Emergency Department
Admission: EM | Admit: 2021-06-18 | Discharge: 2021-06-18 | Disposition: A | Discharge status: Home / Self Care | Payer: Medicare Other | Attending: Emergency Medicine | Admitting: Emergency Medicine

## 2021-06-18 ENCOUNTER — Encounter: Payer: Self-pay | Admitting: Radiology

## 2021-06-18 ENCOUNTER — Emergency Department: Payer: Medicare Other

## 2021-06-18 DIAGNOSIS — I10 Essential (primary) hypertension: Secondary | ICD-10-CM

## 2021-06-18 DIAGNOSIS — F419 Anxiety disorder, unspecified: Secondary | ICD-10-CM

## 2021-06-18 LAB — BASIC METABOLIC PANEL
Anion gap: 12 (ref 5–15)
BUN: 16 mg/dL (ref 8–23)
CO2: 24 mmol/L (ref 22–32)
Calcium: 8.9 mg/dL (ref 8.9–10.3)
Chloride: 96 mmol/L — ABNORMAL LOW (ref 98–111)
Creatinine, Ser: 0.81 mg/dL (ref 0.44–1.00)
GFR, Estimated: >60 mL/min (ref >60)
Glucose, Bld: 98 mg/dL (ref 70–99)
Potassium: 3.7 mmol/L (ref 3.5–5.1)
Sodium: 132 mmol/L — ABNORMAL LOW (ref 135–145)

## 2021-06-18 LAB — T4, FREE: Free T4: 1.2 ng/dL — ABNORMAL HIGH (ref 0.61–1.12)

## 2021-06-18 LAB — CBC
HCT: 40.8 % (ref 36.0–46.0)
Hemoglobin: 13.5 g/dL (ref 12.0–15.0)
MCH: 29.5 pg (ref 26.0–34.0)
MCHC: 33.1 g/dL (ref 30.0–36.0)
MCV: 89.3 fL (ref 80.0–100.0)
Platelets: 203 10*3/uL (ref 150–400)
RBC: 4.57 MIL/uL (ref 3.87–5.11)
RDW: 13.6 % (ref 11.5–15.5)
WBC: 11.3 10*3/uL — ABNORMAL HIGH (ref 4.0–10.5)
nRBC: 0 % (ref 0.0–0.2)

## 2021-06-18 LAB — URINALYSIS, ROUTINE W REFLEX MICROSCOPIC
Bilirubin Urine: NEGATIVE
Glucose, UA: NEGATIVE mg/dL
Hgb urine dipstick: NEGATIVE
Ketones, ur: NEGATIVE mg/dL
Leukocytes,Ua: NEGATIVE
Nitrite: NEGATIVE
Protein, ur: NEGATIVE mg/dL
Specific Gravity, Urine: 1.01 (ref 1.005–1.030)
pH: 6 (ref 5.0–8.0)

## 2021-06-18 LAB — TROPONIN I (HIGH SENSITIVITY)
Troponin I (High Sensitivity): 4 ng/L (ref <18)
Troponin I (High Sensitivity): <2 ng/L (ref <18)

## 2021-06-18 LAB — TSH: TSH: 8.114 u[IU]/mL — ABNORMAL HIGH (ref 0.350–4.500)

## 2021-06-18 MED ORDER — LORAZEPAM 1 MG PO TABS
1.0000 mg | ORAL_TABLET | Freq: Once | ORAL | Status: AC
  Administered 2021-06-18: 1 mg via ORAL
  Filled 2021-06-18: qty 1

## 2021-06-18 MED ORDER — LORAZEPAM 1 MG PO TABS: 1.0000 mg | ORAL_TABLET | Freq: Three times a day (TID) | ORAL | 0 refills | Status: DC | PRN

## 2021-06-18 NOTE — ED Provider Notes (Signed)
[facility] Provider Note    Event Date/Time   First MD Initiated Contact with Patient 06/18/21 0408     (approximate)   History   Hypertension   HPI  Kerry Jefferson is a 76 y.o. female who presents to the ED from home with a chief complaint of elevated blood pressure.  Patient has a history of hypertension and currently take Benicar 40/12.5 mg daily, Toprol XL 50 mg daily.  Has not had recent changes in her medications.  She has rescue Clonidine to take at home up to twice daily as needed.  Reports she had a wellness screen by housecalls who did a [place] flow to test for PAD which was abnormal.  Since then she has been anxious about those results.  Telephone her PCP and has an appointment on 06/20/2021 to follow-up those abnormal test results.  Reports she has always had difficult to control blood pressure.  At one time she took Amlodipine in addition to Toprol and Benicar but it dropped her blood pressure too low.  Denies recent fever, cough, chest pain, shortness of breath, abdominal pain, nausea, vomiting, dysuria or dizziness.  Triage note documents patient was having chest pain but patient denies.  Denies recent travel, trauma or hormone use.     Past Medical History   Past Medical History:  Diagnosis Date   Arthritis    hands, feet   Cancer ([place])    skin   GERD (gastroesophageal reflux disease)    Hepatitis    "8-10 yrs ago". 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   Acute diverticulitis 07/11/2020   AKI (acute kidney injury) ([place]) 09/01/2019   Hypokalemia 09/01/2019   Colitis 08/31/2019   Elevated blood uric acid level 07/29/2019   Generalized osteoarthritis of hand 07/29/2019   Trigger index finger of right hand 07/29/2019   Diverticulitis 06/02/2019    Chest pain at rest 05/28/2019   Heart palpitations 12/25/2017   Allergic rhinitis 08/20/2015   Colon polyp 08/20/2015   Cyst of right ovary 08/20/2015   Endometriosis 08/20/2015   Acid reflux 08/20/2015   History of colitis 08/20/2015   H/O viral illness 08/20/2015   Osteopenia 08/20/2015   Osteoporosis, 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   APC (atrial premature contractions) 07/21/2014   Essential (primary) hypertension 02/10/2014   Facial numbness 12/30/2013   Jerking 12/30/2013   Arthritis, degenerative 11/17/2013   Benign essential HTN 11/10/2013   Cephalalgia 11/10/2013   Mixed hyperlipidemia 11/10/2013   Feeling bilious 07/26/2011     Past Surgical History   Past Surgical History:  Procedure Laterality Date   ABDOMINAL HYSTERECTOMY     BREAST BIOPSY Left 2003   excisional-benign   CATARACT EXTRACTION W/PHACO Right 09/23/2019   Procedure: CATARACT EXTRACTION PHACO AND INTRAOCULAR LENS PLACEMENT ([place]) RIGHT 4.61  00:29.6;  Surgeon: Birder Robson, MD;  Location: [facility];  Service: Ophthalmology;  Laterality: Right;   CATARACT EXTRACTION W/PHACO Left 10/14/2019   Procedure: CATARACT EXTRACTION PHACO AND INTRAOCULAR LENS PLACEMENT ([facility]) LEFT 6.76  00:37.1;  Surgeon: Birder Robson, MD;  Location: [facility];  Service: Ophthalmology;  Laterality: Left;   CHOLECYSTECTOMY     COLONOSCOPY WITH PROPOFOL N/A 10/22/2015   Procedure: COLONOSCOPY WITH PROPOFOL;  Surgeon: Manya Silvas,  MD;  Location: [facility] ENDOSCOPY;  Service: Endoscopy;  Laterality: N/A;   COLONOSCOPY WITH PROPOFOL N/A 09/02/2019   Procedure: COLONOSCOPY WITH PROPOFOL;  Surgeon: Lin Landsman, MD;  Location: [facility] ENDOSCOPY;  Service: Gastroenterology;  Laterality: N/A;   TONSILLECTOMY       Home Medications   Prior to Admission medications   Medication Sig Start Date End Date Taking? Authorizing Provider  LORazepam (ATIVAN) 1 MG tablet Take 1 tablet (1 mg total) by mouth every 8  (eight) hours as needed for anxiety. 06/18/21  Yes Paulette Blanch, MD  Calcium Carbonate-Vitamin D 600-200 MG-UNIT TABS Take 1 tablet by mouth daily. Patient not taking: Reported on 08/05/2020 08/05/12   [provider]  cholecalciferol (VITAMIN D) 25 MCG (1000 UNIT) tablet Take 2,000 Units by mouth daily.    [provider]  cloNIDine (CATAPRES) 0.1 MG tablet Take 0.1 mg by mouth 2 (two) times daily as needed. 04/23/20   [provider]  Coenzyme Q10 (COQ-10 PO) Take 1 tablet by mouth daily.    [provider]  COVID-19 mRNA bivalent vaccine, Pfizer, (PFIZER COVID-19 VAC BIVALENT) injection Inject into the muscle. 04/08/21   Carlyle Basques, MD  desonide (DESOWEN) 0.05 % cream  08/25/19   [provider]  fluticasone (FLONASE) 50 MCG/ACT nasal spray USE TWO SPRAY(S) IN EACH NOSTRIL ONCE DAILY. 06/07/18   [provider]  folic acid (FOLVITE) 671 MCG tablet Take by mouth.    [provider]  ketoconazole (NIZORAL) 2 % cream  08/25/19   [provider]  metoprolol succinate (TOPROL-XL) 50 MG 24 hr tablet Take 50 mg by mouth daily. 03/04/13   [provider]  olmesartan-hydrochlorothiazide (BENICAR HCT) 40-12.5 MG tablet Take 1 tablet by mouth daily. 07/30/19   [provider]  omeprazole (PRILOSEC) 40 MG capsule Take 40 mg by mouth daily as needed.    [provider]  rosuvastatin (CRESTOR) 5 MG tablet Take 5 mg by mouth daily. 05/25/20   [provider]  traZODone (DESYREL) 50 MG tablet Take 150 mg by mouth at bedtime as needed. 04/25/18   [provider]     Allergies  Amlodipine, Atenolol, Ciprofloxacin, Ciprofloxacin hcl, Lisinopril, Moxifloxacin, and Sertraline   Family History   Family History  Problem Relation Age of Onset   Breast cancer Maternal Grandmother 82     Physical Exam  Triage Vital Signs: ED Triage Vitals  Enc Vitals Group     BP 06/17/21 2345 (!) 201/85      Pulse Rate 06/17/21 2345 (!) 58     Resp 06/17/21 2345 16     Temp 06/17/21 2345 98.8 F (37.1 C)     Temp src --      SpO2 06/17/21 2345 100 %     Weight 06/17/21 2346 185 lb (83.9 kg)     Height 06/17/21 2346 5' 5"  (1.651 m)     Head Circumference --      Peak Flow --      Pain Score 06/17/21 2346 3     Pain Loc --      Pain Edu? --      Excl. in [place]? --     Updated Vital Signs: BP 126/68    Pulse (!) 54    Temp 98.8 F (37.1 C)    Resp 18    Ht 5' 5"  (1.651 m)    Wt 83.9 kg    SpO2 94%    BMI 30.79 kg/m  General: Awake, no distress.  CV:  RRR.  Good peripheral perfusion.  Resp:  Normal effort.  CTAB. Abd:  Nontender.  No distention.  Other:  Mildly anxious.  No thyromegaly.   ED Results / Procedures / Treatments  Labs (all labs ordered are listed, but only abnormal results are displayed) Labs Reviewed  BASIC METABOLIC PANEL - Abnormal; Notable for the following components:      Result Value   Sodium 132 (*)    Chloride 96 (*)    All other components within normal limits  CBC - Abnormal; Notable for the following components:   WBC 11.3 (*)    All other components within normal limits  TSH - Abnormal; Notable for the following components:   TSH 8.114 (*)    All other components within normal limits  T4, FREE - Abnormal; Notable for the following components:   Free T4 1.20 (*)    All other components within normal limits  URINALYSIS, ROUTINE W REFLEX MICROSCOPIC  TROPONIN I (HIGH SENSITIVITY)  TROPONIN I (HIGH SENSITIVITY)     EKG  ED ECG REPORT I, Sharissa Brierley J, the attending physician, personally viewed and interpreted this ECG.   Date: 06/18/2021  EKG Time: 2349  Rate: 58  Rhythm: sinus bradycardia  Axis: Normal  Intervals:none  ST&T Change: Nonspecific    RADIOLOGY I have personally reviewed patient's chest x-ray as well as the radiology interpretation:  Chest x-ray: No acute cardiopulmonary process  Official radiology report(s): DG Chest 2  View  Result Date: 06/18/2021 CLINICAL DATA:  Hypertension and chest pain, initial encounter EXAM: CHEST - 2 VIEW COMPARISON:  07/09/2018 FINDINGS: The heart size and mediastinal contours are within normal limits. Both lungs are clear. The visualized skeletal structures are unremarkable. IMPRESSION: No active cardiopulmonary disease. Electronically Signed   By: Inez Catalina M.D.   On: 06/18/2021 00:43     PROCEDURES:  Critical Care performed: No  Procedures   MEDICATIONS ORDERED IN ED: Medications  LORazepam (ATIVAN) tablet 1 mg (1 mg Oral Given 06/18/21 0438)     IMPRESSION / MDM / ASSESSMENT AND PLAN / ED COURSE  I reviewed the triage vital signs and the nursing notes.                             76 year old female presenting with elevated blood pressure.  Differential diagnosis includes but is not limited to ACS, infectious, metabolic, psychosocial stressors, etc.  I have personally reviewed patient's records and note her PCP office visit on 06/09/2021 as well as telephone conversations from 1/24 and yesterday.  The patient is on the cardiac monitor to evaluate for evidence of arrhythmia and/or significant heart rate changes.  Laboratory results notable for normal CBC, mild hyponatremia with sodium 132, negative initial troponin.  This x-ray is unremarkable.  We will check thyroid panel, repeat troponin, UA.  Given her element of anxiety, will trial oral Ativan and reassess.  Clinical Course as of 06/18/21 0607  Sat Jun 18, 2021  0556 Blood pressure improved 140/71.  Patient feeling much better.  Updated patient and spouse on negative UA, negative repeat troponin and mild elevation of TSH/free T4.  She will keep a blood pressure log, I will prescribe limited quantity Ativan for her to take as needed and she has a follow-up appointment with her PCP in 2 days time to follow-up for elevated blood pressure, abnormal PAD test and elevated thyroid function.  Strict return precautions  given.   Patient and spouse verbalized understanding and agree with plan of care. [JS]    Clinical Course User Index [JS] Paulette Blanch, MD     FINAL CLINICAL IMPRESSION(S) / ED DIAGNOSES   Final diagnoses:  Hypertension, unspecified type  Anxiety     Rx / DC Orders   ED Discharge Orders          Ordered    LORazepam (ATIVAN) 1 MG tablet  Every 8 hours PRN        06/18/21 0543             Note:  This document was prepared using Dragon voice recognition software and may include unintentional dictation errors.   Paulette Blanch, MD 06/18/21 [phone]

## 2021-06-18 NOTE — Discharge Instructions (Signed)
You may take Ativan as needed for anxiety.  Continue to take all of your home medications as directed by your doctor.  Keep a log of your blood pressures and take these to your doctor on Monday.  Return to the ER for worsening symptoms, persistent vomiting, difficulty breathing or other concerns.

## 2021-11-01 ENCOUNTER — Other Ambulatory Visit: Payer: Self-pay | Admitting: Neurology

## 2021-11-01 DIAGNOSIS — R29810 Facial weakness: Secondary | ICD-10-CM

## 2021-11-10 ENCOUNTER — Ambulatory Visit
Admission: RE | Admit: 2021-11-10 | Discharge: 2021-11-10 | Disposition: A | Discharge status: Home / Self Care | Payer: Medicare Other | Source: Ambulatory Visit | Attending: Neurology | Admitting: Neurology

## 2021-11-10 DIAGNOSIS — R29810 Facial weakness: Secondary | ICD-10-CM

## 2021-11-10 MED ORDER — GADOBUTROL 1 MMOL/ML IV SOLN
8.5000 mL | Freq: Once | INTRAVENOUS | Status: AC | PRN
  Administered 2021-11-10: 8 mL via INTRAVENOUS

## 2021-11-18 ENCOUNTER — Other Ambulatory Visit: Payer: Self-pay | Admitting: Internal Medicine

## 2021-11-18 DIAGNOSIS — Z1231 Encounter for screening mammogram for malignant neoplasm of breast: Secondary | ICD-10-CM

## 2021-12-21 ENCOUNTER — Ambulatory Visit
Admission: RE | Admit: 2021-12-21 | Discharge: 2021-12-21 | Disposition: A | Discharge status: Home / Self Care | Payer: Medicare Other | Source: Ambulatory Visit | Attending: Internal Medicine | Admitting: Internal Medicine

## 2021-12-21 DIAGNOSIS — Z1231 Encounter for screening mammogram for malignant neoplasm of breast: Secondary | ICD-10-CM | POA: Insufficient documentation

## 2022-08-21 ENCOUNTER — Ambulatory Visit (INDEPENDENT_AMBULATORY_CARE_PROVIDER_SITE_OTHER): Payer: Medicare Other

## 2022-08-21 DIAGNOSIS — K219 Gastro-esophageal reflux disease without esophagitis: Secondary | ICD-10-CM | POA: Diagnosis not present

## 2022-08-21 DIAGNOSIS — R1319 Other dysphagia: Secondary | ICD-10-CM | POA: Diagnosis present

## 2022-08-21 DIAGNOSIS — K222 Esophageal obstruction: Secondary | ICD-10-CM | POA: Diagnosis not present

## 2022-11-13 ENCOUNTER — Other Ambulatory Visit: Payer: Self-pay | Admitting: Internal Medicine

## 2022-11-13 DIAGNOSIS — Z1231 Encounter for screening mammogram for malignant neoplasm of breast: Secondary | ICD-10-CM

## 2022-12-27 ENCOUNTER — Ambulatory Visit
Admission: RE | Admit: 2022-12-27 | Discharge: 2022-12-27 | Disposition: A | Discharge status: Home / Self Care | Payer: Medicare Other | Source: Ambulatory Visit | Attending: Internal Medicine | Admitting: Internal Medicine

## 2022-12-27 DIAGNOSIS — Z1231 Encounter for screening mammogram for malignant neoplasm of breast: Secondary | ICD-10-CM | POA: Insufficient documentation

## 2023-05-28 ENCOUNTER — Other Ambulatory Visit: Payer: Self-pay

## 2023-05-28 ENCOUNTER — Emergency Department
Admission: EM | Admit: 2023-05-28 | Discharge: 2023-05-28 | Disposition: A | Discharge status: Home / Self Care | Payer: Medicare Other | Attending: Emergency Medicine | Admitting: Emergency Medicine

## 2023-05-28 DIAGNOSIS — I10 Essential (primary) hypertension: Secondary | ICD-10-CM | POA: Insufficient documentation

## 2023-05-28 DIAGNOSIS — R42 Dizziness and giddiness: Secondary | ICD-10-CM | POA: Diagnosis present

## 2023-05-28 LAB — CBC
HCT: 40.6 % (ref 36.0–46.0)
Hemoglobin: 13.8 g/dL (ref 12.0–15.0)
MCH: 29 pg (ref 26.0–34.0)
MCHC: 34 g/dL (ref 30.0–36.0)
MCV: 85.3 fL (ref 80.0–100.0)
Platelets: 193 10*3/uL (ref 150–400)
RBC: 4.76 MIL/uL (ref 3.87–5.11)
RDW: 13.6 % (ref 11.5–15.5)
WBC: 10.6 10*3/uL — ABNORMAL HIGH (ref 4.0–10.5)
nRBC: 0 % (ref 0.0–0.2)

## 2023-05-28 LAB — BASIC METABOLIC PANEL
Anion gap: 13 (ref 5–15)
BUN: 13 mg/dL (ref 8–23)
CO2: 26 mmol/L (ref 22–32)
Calcium: 9.1 mg/dL (ref 8.9–10.3)
Chloride: 90 mmol/L — ABNORMAL LOW (ref 98–111)
Creatinine, Ser: 0.74 mg/dL (ref 0.44–1.00)
GFR, Estimated: >60 mL/min (ref >60)
Glucose, Bld: 101 mg/dL — ABNORMAL HIGH (ref 70–99)
Potassium: 3.4 mmol/L — ABNORMAL LOW (ref 3.5–5.1)
Sodium: 129 mmol/L — ABNORMAL LOW (ref 135–145)

## 2023-05-28 LAB — TROPONIN I (HIGH SENSITIVITY): Troponin I (High Sensitivity): 4 ng/L (ref <18)

## 2023-05-28 NOTE — Discharge Instructions (Addendum)
 Tonight, take an additional 5mg  tablet of amlodipine, along with a 0.1mg  tablet of clonidine .  30 minutes later, if blood pressure remains above 160/100, you can take an additional 0.1mg  tablet of clonidine . Starting tomorrow, increase your amlodipine dose to 10mg , taken all at once at your usual medication time.   You can continue to take clonidine  0.1mg  two times a day for blood pressure above 160/100, and you can continue to take a second 0.1mg  tablet if blood pressure remains above 160/100 30 minutes after each clonidine  dose.  If you blood pressure top number is lower than 160 or if the bottom number is lower than 100, don't take clonidine  at that time.

## 2023-05-28 NOTE — ED Triage Notes (Signed)
 Pt takes multiple blood pressure medications and says she can't get it under control. Pt reports HA and flushing of her face when her BP gets high. Pt reports increased in episodes of feeling palpitations. (She has palpitations normally). Pt denies any episodes of tingling or numbness or unilateral weakness. Pt endorses intermittent episodes of feeling swimmy headed.

## 2023-05-28 NOTE — ED Provider Notes (Signed)
 [facility] Provider Note    Event Date/Time   First MD Initiated Contact with Patient 05/28/23 1846     (approximate)   History   Hypertension   HPI  Kerry Jefferson is a 78 y.o. female with a history of hypertension, hyperlipidemia, GERD, vertigo who comes ED complaining of elevated blood pressure.  Reports that she has been compliant with her blood pressure medications, and her doctor recently started her on amlodipine 5 mg as well about 5 days ago, but blood pressure remains elevated.  Denies any significant headaches vision changes paresthesias motor weakness chest pain shortness of breath or other acute complaints.  In triage she endorsed intermittent episodes of dizziness, but she reports this is a chronic vertigo issue for her for which she has seen ENT in the past.  Reviewed outside records noting she had TSH checked about 2 weeks ago, normal.     Physical Exam   Triage Vital Signs: ED Triage Vitals  Encounter Vitals Group     BP 05/28/23 1459 (!) 183/81     Systolic BP Percentile --      Diastolic BP Percentile --      Pulse Rate 05/28/23 1459 64     Resp 05/28/23 1459 16     Temp 05/28/23 1459 (!) 97.5 F (36.4 C)     Temp Source 05/28/23 1459 Oral     SpO2 05/28/23 1459 98 %     Weight 05/28/23 1454 181 lb 3.5 oz (82.2 kg)     Height 05/28/23 1454 5' 5 (1.651 m)     Head Circumference --      Peak Flow --      Pain Score 05/28/23 1457 2     Pain Loc --      Pain Education --      Exclude from Growth Chart --     Most recent vital signs: Vitals:   05/28/23 1900 05/28/23 1915  BP: (!) 179/70 (!) 171/80  Pulse: 61 62  Resp:    Temp:    SpO2:  99%    General: Awake, no distress.  CV:  Good peripheral perfusion.  Regular rate rhythm Resp:  Normal effort.  Clear to auscultation bilaterally Abd:  No distention.  Other:  Cranial nerves III through XII intact.  PERRL, EOMI.  Left external canal has ostial stenosis, otherwise  normal   ED Results / Procedures / Treatments   Labs (all labs ordered are listed, but only abnormal results are displayed) Labs Reviewed  BASIC METABOLIC PANEL - Abnormal; Notable for the following components:      Result Value   Sodium 129 (*)    Potassium 3.4 (*)    Chloride 90 (*)    Glucose, Bld 101 (*)    All other components within normal limits  CBC - Abnormal; Notable for the following components:   WBC 10.6 (*)    All other components within normal limits  TROPONIN I (HIGH SENSITIVITY)   EKG interpreted by me, sinus bradycardia rate of 57.  Normal axis, normal intervals.  Poor R wave progression.  Normal ST segments and T waves.  3 PVCs on the strip.  RADIOLOGY    PROCEDURES:  Procedures   MEDICATIONS ORDERED IN ED: Medications - No data to display   IMPRESSION / MDM / ASSESSMENT AND PLAN / ED COURSE  I reviewed the triage vital signs and the nursing notes.  Differential diagnosis includes, but is not limited to, uncontrolled hypertension, electrolyte derangement, AKI, anemia  Patient comes ED with persistently elevated blood pressure, roughly 185/80.  She has some mild intermittent symptoms, but presentation today is not concerning for cerebral aneurysm.  ICH, ACS, dissection, heart failure, or other acute cardiovascular event.  Will increase amlodipine to 10 mg daily, counseled her on strategies for using clonidine  as well as a bridge to better control blood pressure.  She has a PCP, Dr. Auston, and will plan to follow-up with his office in the next week.      FINAL CLINICAL IMPRESSION(S) / ED DIAGNOSES   Final diagnoses:  Uncontrolled hypertension     Rx / DC Orders   ED Discharge Orders     None        Note:  This document was prepared using Dragon voice recognition software and may include unintentional dictation errors.   Viviann Pastor, MD 05/28/23 2009

## 2023-05-28 NOTE — ED Notes (Signed)
 Patient discharged from ED by provider. Discharge instructions reviewed with patient and all questions answered. Patient ambulatory from ED in NAD.

## 2023-05-28 NOTE — ED Notes (Signed)
 See triage notes. Patient c/o hypertension.

## 2023-06-08 ENCOUNTER — Emergency Department
Admission: EM | Admit: 2023-06-08 | Discharge: 2023-06-08 | Disposition: A | Discharge status: Home / Self Care | Payer: Medicare Other | Attending: Emergency Medicine | Admitting: Emergency Medicine

## 2023-06-08 ENCOUNTER — Other Ambulatory Visit: Payer: Self-pay

## 2023-06-08 ENCOUNTER — Emergency Department: Payer: Medicare Other

## 2023-06-08 DIAGNOSIS — I10 Essential (primary) hypertension: Secondary | ICD-10-CM | POA: Diagnosis not present

## 2023-06-08 DIAGNOSIS — H538 Other visual disturbances: Secondary | ICD-10-CM | POA: Insufficient documentation

## 2023-06-08 DIAGNOSIS — K219 Gastro-esophageal reflux disease without esophagitis: Secondary | ICD-10-CM | POA: Insufficient documentation

## 2023-06-08 DIAGNOSIS — E785 Hyperlipidemia, unspecified: Secondary | ICD-10-CM | POA: Diagnosis not present

## 2023-06-08 DIAGNOSIS — H539 Unspecified visual disturbance: Secondary | ICD-10-CM | POA: Insufficient documentation

## 2023-06-08 DIAGNOSIS — R42 Dizziness and giddiness: Secondary | ICD-10-CM | POA: Diagnosis not present

## 2023-06-08 DIAGNOSIS — R519 Headache, unspecified: Secondary | ICD-10-CM | POA: Diagnosis present

## 2023-06-08 LAB — PROTIME-INR
INR: 1 (ref 0.8–1.2)
Prothrombin Time: 13.6 s (ref 11.4–15.2)

## 2023-06-08 LAB — CBC
HCT: 36.6 % (ref 36.0–46.0)
Hemoglobin: 12.6 g/dL (ref 12.0–15.0)
MCH: 29.5 pg (ref 26.0–34.0)
MCHC: 34.4 g/dL (ref 30.0–36.0)
MCV: 85.7 fL (ref 80.0–100.0)
Platelets: 192 10*3/uL (ref 150–400)
RBC: 4.27 MIL/uL (ref 3.87–5.11)
RDW: 13.5 % (ref 11.5–15.5)
WBC: 9.5 10*3/uL (ref 4.0–10.5)
nRBC: 0 % (ref 0.0–0.2)

## 2023-06-08 LAB — COMPREHENSIVE METABOLIC PANEL
ALT: 17 U/L (ref 0–44)
AST: 21 U/L (ref 15–41)
Albumin: 4 g/dL (ref 3.5–5.0)
Alkaline Phosphatase: 58 U/L (ref 38–126)
Anion gap: 11 (ref 5–15)
BUN: 18 mg/dL (ref 8–23)
CO2: 23 mmol/L (ref 22–32)
Calcium: 8.6 mg/dL — ABNORMAL LOW (ref 8.9–10.3)
Chloride: 91 mmol/L — ABNORMAL LOW (ref 98–111)
Creatinine, Ser: 0.81 mg/dL (ref 0.44–1.00)
GFR, Estimated: >60 mL/min (ref >60)
Glucose, Bld: 97 mg/dL (ref 70–99)
Potassium: 3.8 mmol/L (ref 3.5–5.1)
Sodium: 125 mmol/L — ABNORMAL LOW (ref 135–145)
Total Bilirubin: 0.8 mg/dL (ref 0.0–1.2)
Total Protein: 6.4 g/dL — ABNORMAL LOW (ref 6.5–8.1)

## 2023-06-08 LAB — DIFFERENTIAL
Abs Immature Granulocytes: 0.04 10*3/uL (ref 0.00–0.07)
Basophils Absolute: 0 10*3/uL (ref 0.0–0.1)
Basophils Relative: 0 %
Eosinophils Absolute: 0.2 10*3/uL (ref 0.0–0.5)
Eosinophils Relative: 2 %
Immature Granulocytes: 0 %
Lymphocytes Relative: 17 %
Lymphs Abs: 1.6 10*3/uL (ref 0.7–4.0)
Monocytes Absolute: 0.6 10*3/uL (ref 0.1–1.0)
Monocytes Relative: 6 %
Neutro Abs: 7.1 10*3/uL (ref 1.7–7.7)
Neutrophils Relative %: 75 %

## 2023-06-08 LAB — APTT: aPTT: 31 s (ref 24–36)

## 2023-06-08 LAB — CBG MONITORING, ED: Glucose-Capillary: 102 mg/dL — ABNORMAL HIGH (ref 70–99)

## 2023-06-08 LAB — ETHANOL: Alcohol, Ethyl (B): <10 mg/dL (ref <10)

## 2023-06-08 MED ORDER — SODIUM CHLORIDE 0.9% FLUSH: 3.0000 mL | Freq: Once | INTRAVENOUS | Status: DC

## 2023-06-08 NOTE — ED Provider Notes (Signed)
[facility] Provider Note    Event Date/Time   First MD Initiated Contact with Patient 06/08/23 1822     (approximate)   History   Chief Complaint: Visual Field Change   HPI  Kerry Jefferson is a 78 y.o. female with a history of hypertension, hyperlipidemia, GERD, vertigo who comes ED complaining of flashing lights lasting about 15 minutes occurring at about 5:30 PM today.  Associated with headache.  Symptoms have now resolved.  No motor weakness or paresthesias.  No change in balance or coordination, no fever, no neck pain, no trauma.  Denies history of migraine.  Also complains of variable blood pressure lately.  Sometimes is relatively normal at about 120/70, and sometimes is elevated at 160/80.  She has been compliant with her medications, also takes low-dose clonidine as needed as prescribed          Physical Exam   Triage Vital Signs: ED Triage Vitals  Encounter Vitals Group     BP 06/08/23 1809 (!) 165/87     Systolic BP Percentile --      Diastolic BP Percentile --      Pulse Rate 06/08/23 1809 67     Resp 06/08/23 1809 17     Temp 06/08/23 1809 98 F (36.7 C)     Temp Source 06/08/23 1809 Oral     SpO2 06/08/23 1809 98 %     Weight 06/08/23 1810 181 lb 3.5 oz (82.2 kg)     Height 06/08/23 1810 5\' 5"  (1.651 m)     Head Circumference --      Peak Flow --      Pain Score 06/08/23 1809 0     Pain Loc --      Pain Education --      Exclude from Growth Chart --     Most recent vital signs: Vitals:   06/08/23 2130 06/08/23 2200  BP: (!) 154/70 (!) 165/72  Pulse: (!) 57 62  Resp: 10 19  Temp:    SpO2: 100% 100%    General: Awake, no distress.  CV:  Good peripheral perfusion.  Regular rate rhythm Resp:  Normal effort.  Clear to auscultation bilaterally Abd:  No distention.  Other:  Cranial nerves III through XII intact.  No motor drift, normal speech and language, funduscopy normal bilaterally with sharp optic disks.  NIH stroke  scale 0   ED Results / Procedures / Treatments   Labs (all labs ordered are listed, but only abnormal results are displayed) Labs Reviewed  COMPREHENSIVE METABOLIC PANEL - Abnormal; Notable for the following components:      Result Value   Sodium 125 (*)    Chloride 91 (*)    Calcium 8.6 (*)    Total Protein 6.4 (*)    All other components within normal limits  CBG MONITORING, ED - Abnormal; Notable for the following components:   Glucose-Capillary 102 (*)    All other components within normal limits  PROTIME-INR  APTT  CBC  DIFFERENTIAL  ETHANOL     EKG    RADIOLOGY CT head interpreted by me, negative for intracranial hemorrhage or mass.  Radiology report reviewed   PROCEDURES:  Procedures   MEDICATIONS ORDERED IN ED: Medications  sodium chloride flush (NS) 0.9 % injection 3 mL ( Intravenous Canceled Entry 06/08/23 1838)     IMPRESSION / MDM / ASSESSMENT AND PLAN / ED COURSE  I reviewed the triage vital signs and the nursing  notes.  DDx: Migraine, ocular disease, ischemic stroke, corneal dryness.  Doubt glaucoma, intracranial hypertension, meningitis, dissection, sinus thrombosis  Patient's presentation is most consistent with acute presentation with potential threat to life or bodily function.  Patient presents with transient visual disturbance.  Code stroke was activated from triage.  Initial CT head unremarkable, exam reassuring, will follow-up neurology recommendations   Clinical Course as of 06/08/23 2257  Fri Jun 08, 2023  1924 D/w neurology - low suspicion for CVA, suspect transient ocular phenomenon. recommend MRI and if neg for stroke can DC to ophtho f/u.  [PS]    Clinical Course User Index [PS] Sharman Cheek, MD    ----------------------------------------- 10:57 PM on 06/08/2023 ----------------------------------------- MRI negative.  Stable for discharge   FINAL CLINICAL IMPRESSION(S) / ED DIAGNOSES   Final diagnoses:  Visual  disturbance     Rx / DC Orders   ED Discharge Orders     None        Note:  This document was prepared using Dragon voice recognition software and may include unintentional dictation errors.   Sharman Cheek, MD 06/08/23 2257

## 2023-06-08 NOTE — Consult Note (Signed)
[facility] Tele[facility]urology Consult Services   Patient Name:   Kerry, Jefferson Date of Birth:   08-Aug-1945 Identification Number:   MRN - [id number] Date of Service:   06/08/2023 18:30:43  Diagnosis:       H53.8 - Blurred Vision  Impression:        1. The patient is a 78 year old woman with transient flashes of light in her left eye only off-and-on for about 10-15 minutes. No other focal neurologic complaints and symptoms have resolved. She does not have a headache nor does she have any history of migraines. Differential diagnosis would include intraocular phenomenon such as a retinal detachment tear or migrainous phenomenon. Less likely would be a vascular event. Recommend MRI of the brain without contrast and if this is unremarkable. She should follow up as an outpatient with ophthalmology. Given not only one I was involved in only positive visual phenomenon a vascular event seems less likely    ------------------------------------------------------------------------------  Advanced Imaging: Advanced Imaging Deferred because:  Non-disabling symptoms as verified by the patient; no cortical signs so not consistent with LVO   Metrics: Last Known Well: 06/08/2023 17:30:00 Dispatch Time: 06/08/2023 18:30:43 Arrival Time: 06/08/2023 17:56:00 Initial Response Time: 06/08/2023 18:39:07 Symptoms: blurry vision and flashes of light in left eye dizziness. Initial patient interaction: 06/08/2023 19:02:09 NIHSS Assessment Completed: 06/08/2023 19:12:00 Patient is not a candidate for Thrombolytic. Thrombolytic Medical Decision: 06/08/2023 19:12:00 Patient was not deemed candidate for Thrombolytic because of following reasons: Resolved symptoms .  CT head showed no acute hemorrhage or acute core infarct.  Primary Provider Notified of Diagnostic Impression and Management Plan on: 06/08/2023  19:29:45    ------------------------------------------------------------------------------  History of Present Illness: Patient is a 78 year old Female.  Patient was brought by private transportation with symptoms of blurry vision and flashes of light in left eye dizziness. The patient is a 78 year old woman with a history of hypertension and a chronic left facial droop. Her doctor did an MRI of the brain did not find any evidence of stroke several years ago. Today, she presents for about 10 minutes of intermittent bright lights in her left eye only left-sided vision. She states that it looked like a zigzag line. No headache, no other weakness numbness or tingling. She does not have any history of migraines. Currently, she feels completely back to normal.   Past Medical History:      Hypertension      Hyperlipidemia Other PMH:  chronic left facial droop, OSA  Medications:  No Anticoagulant use  No Antiplatelet use Reviewed EMR for current medications  Allergies:  Reviewed  Social History: Drug Use: No  Family History:  There is no family history of premature cerebrovascular disease pertinent to this consultation  ROS : 14 Points Review of Systems was performed and was negative except mentioned in HPI.  Past Surgical History: There Is No Surgical History Contributory To Today's Visit    Examination: BP(150/80), Pulse(75), 1A: Level of Consciousness - Alert; keenly responsive + 0 1B: Ask Month and Age - Both Questions Right + 0 1C: Blink Eyes & Squeeze Hands - Performs Both Tasks + 0 2: Test Horizontal Extraocular Movements - Normal + 0 3: Test Visual Fields - No Visual Loss + 0 4: Test Facial Palsy (Use Grimace if Obtunded) - Minor paralysis (flat nasolabial fold, smile asymmetry) + 1 5A: Test Left Arm Motor Drift - No Drift for 10 Seconds + 0 5B: Test Right Arm Motor Drift - No Drift for 10 Seconds +  0 6A: Test Left Leg Motor Drift - No Drift for 5 Seconds + 0 6B:  Test Right Leg Motor Drift - No Drift for 5 Seconds + 0 7: Test Limb Ataxia (FNF/Heel-Shin) - No Ataxia + 0 8: Test Sensation - Normal; No sensory loss + 0 9: Test Language/Aphasia - Normal; No aphasia + 0 10: Test Dysarthria - Normal + 0 11: Test Extinction/Inattention - No abnormality + 0  NIHSS Score: 1  NIHSS Free Text : left facial droop very mild chronic  Pre-Morbid Modified Rankin Scale: 0 Points = No symptoms at all  Spoke with : ED MD  This consult was conducted in real time using interactive audio and Immunologist. Patient was informed of the technology being used for this visit and agreed to proceed. Patient located in hospital and provider located at home/office setting.   Patient is being evaluated for possible acute neurologic impairment and high probability of imminent or life-threatening deterioration. I spent total of 35 minutes providing care to this patient, including time for face to face visit via telemedicine, review of medical records, imaging studies and discussion of findings with providers, the patient and/or family.   Dr Jossie Ng   [facility] For Inpatient follow-up with [facility] physician please call [facility] at [phone]. As we are not an outpatient service for any post hospital discharge needs please contact the hospital for assistance. If you have any questions for the [facility] physicians or need to reconsult for clinical or diagnostic changes please contact us via [facility] at [phone].

## 2023-06-08 NOTE — ED Notes (Signed)
Code  stroke  called  to  at  615pm

## 2023-06-08 NOTE — ED Notes (Signed)
Patient to MRI at this time.

## 2023-06-08 NOTE — Progress Notes (Signed)
Code Stroke activated @ 1821 in the CT dept.  [facility] Neurology paged @ [phone] and on camera @ 1827.   Dr. Selina Cooley had technical issues and asked TSRN to page [facility].  Back to ED @ 1835. TS paged @ 1830 and on camera @ 1901.  Josiah Lobo BSN, Occupational hygienist

## 2023-06-08 NOTE — ED Notes (Signed)
Pt ambulated to bathroom. Steady, even gait. Pt ambulated back to bed, monitors on, friend at bedside.

## 2023-06-08 NOTE — Progress Notes (Signed)
   06/08/23 1825  Spiritual Encounters  Type of Visit Initial  Care provided to: Patient;Friend  Conversation partners present during encounter Nurse  Referral source Code page  Reason for visit Code  OnCall Visit Yes  Spiritual Framework  Presenting Themes  (Anxious)  Patient Stress Factors Other (Comment) (Anxious)  Family Stress Factors None identified   Chaplain visited patient and friend and offered prayer, presence and active listening.  Offered prayer at patient's request and called spouse for the patient.  Chaplain will follow-up with patient, family or staff as requested.   Rev. Rana M. Earlene Plater, MDiv. Chaplain Resident [facility]

## 2023-06-08 NOTE — ED Notes (Signed)
Patient back from MRI, placed back onto monitor including CCM, call light within reach.

## 2023-06-08 NOTE — ED Provider Triage Note (Signed)
Emergency Medicine Provider Triage Evaluation Note  Kerry Jefferson , a 78 y.o. female  was evaluated in triage.  Pt complains of seeing lights flash in left eye and feeling very dizzy today. Symptoms started about 1730. Flashing lights were persistent and lasted about 15 minutes. She continues to feel dizzy.  Physical Exam  BP (!) 165/87 (BP Location: Left Arm)   Pulse 67   Temp 98 F (36.7 C) (Oral)   Resp 17   Ht 5\' 5"  (1.651 m)   Wt 82.2 kg   SpO2 98%   BMI 30.16 kg/m  Gen:   Awake, no distress   Resp:  Normal effort  MSK:   Moves extremities without difficulty  Other:  No facial droop, left side pronator drift, no weakness of lower extremities.  Medical Decision Making  Medically screening exam initiated at 6:18 PM.  Appropriate orders placed.  MONASIA SPEDALE was informed that the remainder of the evaluation will be completed by another provider, this initial triage assessment does not replace that evaluation, and the importance of remaining in the ED until their evaluation is complete.  Code stroke activated.   Chinita Pester, FNP 06/08/23 1821

## 2023-06-08 NOTE — ED Triage Notes (Addendum)
Pt sts that she has been seeing flashing lights  today around 1730. Pt sts that she has also been feeling swimmy headed today. Pt advised that the flashing lights only last 15 min. Pt advised that she gets swimmy headed when her pt is elevated. Pt told RN that she took her PRN klonopin around 1600 today. PA in triage with pt.

## 2023-06-08 NOTE — ED Notes (Signed)
 Awaiting teleneuro.

## 2023-06-08 NOTE — ED Notes (Signed)
Patient given discharge instructions including importance of follow up appt with stated understanding. INT removed, cannula intact, pressure dressing applied. Patient stable and ambulatory with steady even gait on dispo.

## 2023-12-20 ENCOUNTER — Other Ambulatory Visit: Payer: Self-pay | Admitting: Internal Medicine

## 2023-12-20 DIAGNOSIS — Z1231 Encounter for screening mammogram for malignant neoplasm of breast: Secondary | ICD-10-CM

## 2024-01-02 ENCOUNTER — Ambulatory Visit
Admission: RE | Admit: 2024-01-02 | Discharge: 2024-01-02 | Disposition: A | Discharge status: Home / Self Care | Source: Ambulatory Visit | Attending: Internal Medicine | Admitting: Internal Medicine

## 2024-01-02 DIAGNOSIS — Z1231 Encounter for screening mammogram for malignant neoplasm of breast: Secondary | ICD-10-CM | POA: Diagnosis present

## 2024-03-21 ENCOUNTER — Other Ambulatory Visit: Payer: Self-pay | Admitting: Nurse Practitioner

## 2024-03-21 DIAGNOSIS — E782 Mixed hyperlipidemia: Secondary | ICD-10-CM

## 2024-03-21 DIAGNOSIS — I493 Ventricular premature depolarization: Secondary | ICD-10-CM

## 2024-03-21 DIAGNOSIS — R9439 Abnormal result of other cardiovascular function study: Secondary | ICD-10-CM

## 2024-03-21 DIAGNOSIS — I10 Essential (primary) hypertension: Secondary | ICD-10-CM

## 2024-03-28 ENCOUNTER — Encounter (HOSPITAL_COMMUNITY): Payer: Self-pay

## 2024-03-28 ENCOUNTER — Telehealth (HOSPITAL_COMMUNITY): Payer: Self-pay | Admitting: Emergency Medicine

## 2024-03-28 NOTE — Telephone Encounter (Signed)
 Attempted to call patient regarding upcoming cardiac CT appointment. Left message on voicemail with name and callback number Rockwell Alexandria RN Navigator Cardiac Imaging [facility] [phone] Office [phone] Cell

## 2024-03-31 ENCOUNTER — Ambulatory Visit
Admission: RE | Admit: 2024-03-31 | Discharge: 2024-03-31 | Disposition: A | Discharge status: Home / Self Care | Source: Ambulatory Visit | Attending: Nurse Practitioner | Admitting: Nurse Practitioner

## 2024-03-31 DIAGNOSIS — E782 Mixed hyperlipidemia: Secondary | ICD-10-CM | POA: Diagnosis not present

## 2024-03-31 DIAGNOSIS — R079 Chest pain, unspecified: Secondary | ICD-10-CM | POA: Insufficient documentation

## 2024-03-31 DIAGNOSIS — I10 Essential (primary) hypertension: Secondary | ICD-10-CM | POA: Insufficient documentation

## 2024-03-31 DIAGNOSIS — R9439 Abnormal result of other cardiovascular function study: Secondary | ICD-10-CM | POA: Insufficient documentation

## 2024-03-31 DIAGNOSIS — I493 Ventricular premature depolarization: Secondary | ICD-10-CM | POA: Diagnosis present

## 2024-03-31 MED ORDER — NITROGLYCERIN 0.4 MG SL SUBL
0.8000 mg | SUBLINGUAL_TABLET | Freq: Once | SUBLINGUAL | Status: AC
  Administered 2024-03-31: 0.8 mg via SUBLINGUAL

## 2024-03-31 MED ORDER — IOHEXOL 350 MG/ML SOLN
100.0000 mL | Freq: Once | INTRAVENOUS | Status: AC | PRN
  Administered 2024-03-31: 100 mL via INTRAVENOUS

## 2024-03-31 NOTE — Progress Notes (Signed)
 Patient tolerated CT well. Vital signs stable encourage to drink water throughout day.Reasons explained and verbalized understanding. Ambulated steady gait.

## 2024-04-01 ENCOUNTER — Emergency Department
Admission: EM | Admit: 2024-04-01 | Discharge: 2024-04-01 | Disposition: A | Discharge status: Home / Self Care | Attending: Emergency Medicine | Admitting: Emergency Medicine

## 2024-04-01 ENCOUNTER — Other Ambulatory Visit: Payer: Self-pay

## 2024-04-01 DIAGNOSIS — I10 Essential (primary) hypertension: Secondary | ICD-10-CM | POA: Insufficient documentation

## 2024-04-01 LAB — BASIC METABOLIC PANEL WITH GFR
Anion gap: 12 (ref 5–15)
BUN: 16 mg/dL (ref 8–23)
CO2: 25 mmol/L (ref 22–32)
Calcium: 9 mg/dL (ref 8.9–10.3)
Chloride: 99 mmol/L (ref 98–111)
Creatinine, Ser: 0.8 mg/dL (ref 0.44–1.00)
GFR, Estimated: >60 mL/min (ref >60)
Glucose, Bld: 91 mg/dL (ref 70–99)
Potassium: 3.9 mmol/L (ref 3.5–5.1)
Sodium: 136 mmol/L (ref 135–145)

## 2024-04-01 LAB — CBC
HCT: 40.3 % (ref 36.0–46.0)
Hemoglobin: 12.9 g/dL (ref 12.0–15.0)
MCH: 28.5 pg (ref 26.0–34.0)
MCHC: 32 g/dL (ref 30.0–36.0)
MCV: 89 fL (ref 80.0–100.0)
Platelets: 179 K/uL (ref 150–400)
RBC: 4.53 MIL/uL (ref 3.87–5.11)
RDW: 14.5 % (ref 11.5–15.5)
WBC: 10.3 K/uL (ref 4.0–10.5)
nRBC: 0 % (ref 0.0–0.2)

## 2024-04-01 MED ORDER — LORAZEPAM 1 MG PO TABS
1.0000 mg | ORAL_TABLET | Freq: Once | ORAL | Status: AC
  Administered 2024-04-01: 1 mg via ORAL
  Filled 2024-04-01: qty 1

## 2024-04-01 MED ORDER — CLONIDINE HCL 0.1 MG PO TABS
0.1000 mg | ORAL_TABLET | Freq: Once | ORAL | Status: AC
  Administered 2024-04-01: 0.1 mg via ORAL
  Filled 2024-04-01: qty 1

## 2024-04-01 MED ORDER — LORAZEPAM 0.5 MG PO TABS: 0.5000 mg | ORAL_TABLET | Freq: Every day | ORAL | 0 refills | Status: AC | PRN

## 2024-04-01 NOTE — ED Provider Notes (Signed)
 [facility] Provider Note    Event Date/Time   First MD Initiated Contact with Patient 04/01/24 1735     (approximate)  History   Chief Complaint: Hypertension  HPI  Kerry Jefferson is a 78 y.o. female with a past medical history of arthritis, gastric reflux, hypertension, hyperlipidemia, presents to the emergency department for high blood pressure.  According to the patient she has been taking her prescribed medications which include metoprolol , clonidine  and Benicar .  Patient states she was recently taken off hydrochlorothiazide .  Patient states her blood pressure has continued to increase despite medication as well as dietary and exercise changes.  She went to her PCP today who checked her blood pressure and it was 190 systolic and they sent her to the emergency department for further workup and treatment.  Patient states she was taken off of the hydrochlorothiazide  several months ago due to worries about renal function per patient.  Patient denies any chest pain.  No shortness of breath.  Has noted some slight fluid around her ankles.  Physical Exam   Triage Vital Signs: ED Triage Vitals  Encounter Vitals Group     BP 04/01/24 1506 (!) 219/96     Girls Systolic BP Percentile --      Girls Diastolic BP Percentile --      Boys Systolic BP Percentile --      Boys Diastolic BP Percentile --      Pulse Rate 04/01/24 1506 63     Resp 04/01/24 1506 18     Temp 04/01/24 1506 98.4 F (36.9 C)     Temp Source 04/01/24 1506 Oral     SpO2 04/01/24 1506 97 %     Weight --      Height --      Head Circumference --      Peak Flow --      Pain Score 04/01/24 1509 4     Pain Loc --      Pain Education --      Exclude from Growth Chart --     Most recent vital signs: Vitals:   04/01/24 1506  BP: (!) 219/96  Pulse: 63  Resp: 18  Temp: 98.4 F (36.9 C)  SpO2: 97%    General: Awake, no distress.  CV:  Good peripheral perfusion.  Regular rate and rhythm   Resp:  Normal effort.  Equal breath sounds bilaterally.  Abd:  No distention.     ED Results / Procedures / Treatments   EKG  EKG viewed and interpreted by myself shows a sinus bradycardia 56 bpm with a narrow QRS, normal axis, normal intervals, no concerning ST changes.  MEDICATIONS ORDERED IN ED: Medications  cloNIDine  (CATAPRES ) tablet 0.1 mg (has no administration in time range)  LORazepam  (ATIVAN ) tablet 1 mg (has no administration in time range)     IMPRESSION / MDM / ASSESSMENT AND PLAN / ED COURSE  I reviewed the triage vital signs and the nursing notes.  Patient's presentation is most consistent with acute presentation with potential threat to life or bodily function.  Patient presents emergency department for hypertension.  Patient states she has been checking it at home and it has been going up.  Normally around 160 or 180 at home she went to her PCP and it was 190 and they sent her to the emergency department for further workup.  Patient denies any chest pain states she has had a dull headache over the last couple  days intermittently.  Has had a mild amount of swelling in her ankles as well since stopping the hydrochlorothiazide  several months ago per patient.  Patient's lab work today shows a reassuring CBC reassuring chemistry.  Patient's blood pressure on arrival is 219/96.  Patient took her morning clonidine  but has not yet taken her evening clonidine .  Will dose the patient's evening clonidine .  Patient does appear somewhat stressed/anxious regarding her blood pressure.  We will dose a small amount of Ativan  and continue to closely monitor.  Patient's workup is reassuring.  Blood pressures come down nicely after a dose of Ativan  currently 139/65.  Discussed with the patient likelihood of a psychological component/stress/anxiety relating to her blood pressure.  Patient states she was recently prescribed BuSpar by her PCP but has not yet started it.  I encouraged the  patient to go ahead and start this medication.  I discussed patient should talk to her PCP regarding whether or not to continue venlafaxine in addition to BuSpar.  We will also discharge with a very short course of half milligram Ativan  tablets to be used if needed for severe anxiety as this appeared to work very well for the patient's blood pressure.  I discussed with the patient she should follow-up with her PCP regarding this medication however as to whether or not they will continue this chronically.  FINAL CLINICAL IMPRESSION(S) / ED DIAGNOSES   Hypertension    Note:  This document was prepared using Dragon voice recognition software and may include unintentional dictation errors.   Dorothyann Drivers, MD 04/01/24 2045

## 2024-04-01 NOTE — ED Triage Notes (Signed)
 Pt comes for HTN. She checked it at home and it was about 180/90. Pt has been followed by PCP for it and recently had a stress test. Pt has mild headache. Occasionally has palpitations but none currently. Was recently taken off hydrochlorothiazide  due to lab work which has made BP worse.

## 2024-04-01 NOTE — ED Notes (Signed)
 PT in no acute distress prior to discharge. Discharged instructions reviewed, all questions answered and pt verbalized understanding at this time. Pt has all belongings with them at time of discharge.

## 2024-04-01 NOTE — ED Notes (Signed)
 Pt ambulated to bathroom without difficulty.

## 2024-04-02 ENCOUNTER — Other Ambulatory Visit: Payer: Self-pay

## 2024-04-02 DIAGNOSIS — I10 Essential (primary) hypertension: Secondary | ICD-10-CM

## 2024-04-07 ENCOUNTER — Ambulatory Visit
Admission: RE | Admit: 2024-04-07 | Discharge: 2024-04-07 | Disposition: A | Discharge status: Home / Self Care | Source: Ambulatory Visit

## 2024-04-07 DIAGNOSIS — I10 Essential (primary) hypertension: Secondary | ICD-10-CM | POA: Diagnosis present

## 2024-04-07 DIAGNOSIS — I1A0 Resistant hypertension: Secondary | ICD-10-CM | POA: Diagnosis not present

## 2024-04-15 ENCOUNTER — Other Ambulatory Visit: Payer: Self-pay | Admitting: Internal Medicine

## 2024-04-15 DIAGNOSIS — R55 Syncope and collapse: Secondary | ICD-10-CM

## 2024-04-15 DIAGNOSIS — I10 Essential (primary) hypertension: Secondary | ICD-10-CM

## 2024-04-22 ENCOUNTER — Ambulatory Visit: Admission: RE | Admit: 2024-04-22 | Discharge: 2024-04-22 | Attending: Internal Medicine | Admitting: Internal Medicine

## 2024-04-22 DIAGNOSIS — R55 Syncope and collapse: Secondary | ICD-10-CM | POA: Diagnosis present

## 2024-04-22 DIAGNOSIS — I10 Essential (primary) hypertension: Secondary | ICD-10-CM | POA: Insufficient documentation

## 2024-04-23 ENCOUNTER — Ambulatory Visit: Admitting: Podiatry

## 2024-04-23 DIAGNOSIS — M722 Plantar fascial fibromatosis: Secondary | ICD-10-CM

## 2024-04-23 NOTE — Progress Notes (Signed)
 Subjective:  Patient ID: Kerry Jefferson, female    DOB: 01-09-1946,  MRN: [id number] HPI Chief Complaint  Patient presents with   Bunions    Bilateral bunions and hammertoes - she has some porokeratosis and a small soft tissue lump in the arch of her left foot    78 y.o. female presents with the above complaint.   ROS: Denies fever chills nausea vomiting muscle aches pains calf pain back pain chest pain shortness of breath.  Past Medical History:  Diagnosis Date   Arthritis    hands, feet   Cancer (HCC)    skin   GERD (gastroesophageal reflux disease)    Hepatitis    8-10 yrs ago. brought on by antibiotics   Hyperlipidemia    Hypertension    Tendinitis    Vertigo    1x/month   Past Surgical History:  Procedure Laterality Date   ABDOMINAL HYSTERECTOMY     BREAST BIOPSY Left 2003   excisional-benign   BREAST EXCISIONAL BIOPSY Left 2003   CATARACT EXTRACTION W/PHACO Right 09/23/2019   Procedure: CATARACT EXTRACTION PHACO AND INTRAOCULAR LENS PLACEMENT (IOC) RIGHT 4.61  00:29.6;  Surgeon: Jaye Fallow, MD;  Location: [facility];  Service: Ophthalmology;  Laterality: Right;   CATARACT EXTRACTION W/PHACO Left 10/14/2019   Procedure: CATARACT EXTRACTION PHACO AND INTRAOCULAR LENS PLACEMENT (IOC) LEFT 6.76  00:37.1;  Surgeon: Jaye Fallow, MD;  Location: [facility];  Service: Ophthalmology;  Laterality: Left;   CHOLECYSTECTOMY     COLONOSCOPY WITH PROPOFOL  N/A 10/22/2015   Procedure: COLONOSCOPY WITH PROPOFOL ;  Surgeon: Lamar ONEIDA Holmes, MD;  Location: [facility];  Service: Endoscopy;  Laterality: N/A;   COLONOSCOPY WITH PROPOFOL  N/A 09/02/2019   Procedure: COLONOSCOPY WITH PROPOFOL ;  Surgeon: Unk Corinn Skiff, MD;  Location: [facility];  Service: Gastroenterology;  Laterality: N/A;   TONSILLECTOMY      Current Outpatient Medications:    Calcium  Carbonate-Vitamin D  600-200 MG-UNIT TABS, Take 1 tablet by mouth daily. (Patient not  taking: Reported on 08/05/2020), Disp: , Rfl:    cholecalciferol  (VITAMIN D ) 25 MCG (1000 UNIT) tablet, Take 2,000 Units by mouth daily., Disp: , Rfl:    cloNIDine  (CATAPRES ) 0.1 MG tablet, Take 0.1 mg by mouth 2 (two) times daily as needed., Disp: , Rfl:    Coenzyme Q10 (COQ-10 PO), Take 1 tablet by mouth daily., Disp: , Rfl:    COVID-19 mRNA bivalent vaccine, Pfizer, (PFIZER COVID-19 VAC BIVALENT) injection, Inject into the muscle., Disp: 0.3 mL, Rfl: 0   desonide (DESOWEN) 0.05 % cream, , Disp: , Rfl:    fluticasone  (FLONASE ) 50 MCG/ACT nasal spray, USE TWO SPRAY(S) IN EACH NOSTRIL ONCE DAILY., Disp: , Rfl:    folic acid  (FOLVITE ) 800 MCG tablet, Take by mouth., Disp: , Rfl:    ketoconazole (NIZORAL) 2 % cream, , Disp: , Rfl:    LORazepam  (ATIVAN ) 0.5 MG tablet, Take 1 tablet (0.5 mg total) by mouth daily as needed for anxiety., Disp: 30 tablet, Rfl: 0   metoprolol  succinate (TOPROL -XL) 50 MG 24 hr tablet, Take 50 mg by mouth daily., Disp: , Rfl:    olmesartan -hydrochlorothiazide  (BENICAR  HCT) 40-12.5 MG tablet, Take 1 tablet by mouth daily., Disp: , Rfl:    omeprazole (PRILOSEC) 40 MG capsule, Take 40 mg by mouth daily as needed., Disp: , Rfl:    rosuvastatin (CRESTOR) 5 MG tablet, Take 5 mg by mouth daily., Disp: , Rfl:    traZODone  (DESYREL ) 50 MG tablet, Take 150 mg by mouth  at bedtime as needed., Disp: , Rfl:   Allergies  Allergen Reactions   Atenolol Other (See Comments)    fatigue   Ciprofloxacin  Other (See Comments)    Caused drug induced hepatitis / c-diff   Ciprofloxacin  Hcl Other (See Comments)    Hepatitis   Lisinopril Other (See Comments)    Fatigue   Moxifloxacin  Other (See Comments)    Hepatitis   Sertraline Other (See Comments)   Review of Systems Objective:  There were no vitals filed for this visit.  General: Well developed, nourished, in no acute distress, alert and oriented x3   Dermatological: Skin is warm, dry and supple bilateral. Nails x 10 are well  maintained; remaining integument appears unremarkable at this time. There are no open sores, no preulcerative lesions, no rash or signs of infection present.  She has a small either plantar fibroma or leiomyoma beneath the first metatarsal just proximal to the sesamoids.  Vascular: Dorsalis Pedis artery and Posterior Tibial artery pedal pulses are 2/4 bilateral with immedate capillary fill time. Pedal hair growth present. No varicosities and no lower extremity edema present bilateral.   Neruologic: Grossly intact via light touch bilateral. Vibratory intact via tuning fork bilateral. Protective threshold with Semmes Wienstein monofilament intact to all pedal sites bilateral. Patellar and Achilles deep tendon reflexes 2+ bilateral. No Babinski or clonus noted bilateral.   Musculoskeletal: No gross boney pedal deformities bilateral. No pain, crepitus, or limitation noted with foot and ankle range of motion bilateral. Muscular strength 5/5 in all groups tested bilateral.  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    Radiographs: On these left  None taken  Assessment & Plan:   Assessment: Plantar fibroma/leiomyoma left foot  Plan: Discussed that if it were to get any larger or become painful we would inject it or excise it.     Verland Sprinkle T. [place], [place]
